# Patient Record
Sex: Female | Born: 1970
Health system: Southern US, Community
[De-identification: ages and names within clinical notes are randomized; demographics above are authoritative.]

## PROBLEM LIST (undated history)

## (undated) DIAGNOSIS — I10 Essential (primary) hypertension: Secondary | ICD-10-CM

## (undated) DIAGNOSIS — E0590 Thyrotoxicosis, unspecified without thyrotoxic crisis or storm: Secondary | ICD-10-CM

## (undated) DIAGNOSIS — E079 Disorder of thyroid, unspecified: Secondary | ICD-10-CM

## (undated) DIAGNOSIS — E119 Type 2 diabetes mellitus without complications: Secondary | ICD-10-CM

## (undated) DIAGNOSIS — C541 Malignant neoplasm of endometrium: Secondary | ICD-10-CM

## (undated) DIAGNOSIS — D649 Anemia, unspecified: Secondary | ICD-10-CM

## (undated) HISTORY — DX: Malignant neoplasm of endometrium: C54.1

## (undated) HISTORY — DX: Disorder of thyroid, unspecified: E07.9

## (undated) HISTORY — PX: ECTOPIC PREGNANCY SURGERY: SHX613

## (undated) HISTORY — PX: ABDOMINAL HYSTERECTOMY: SHX81

## (undated) HISTORY — DX: Anemia, unspecified: D64.9

## (undated) HISTORY — PX: DILATION AND CURETTAGE, DIAGNOSTIC / THERAPEUTIC: SUR384

---

## 1997-11-24 ENCOUNTER — Inpatient Hospital Stay (HOSPITAL_COMMUNITY): Admission: RE | Admit: 1997-11-24 | Discharge: 1997-11-24 | Payer: Self-pay | Admitting: *Deleted

## 2004-11-09 ENCOUNTER — Emergency Department: Payer: Self-pay | Admitting: Emergency Medicine

## 2010-07-07 ENCOUNTER — Emergency Department: Payer: Self-pay | Admitting: Emergency Medicine

## 2010-07-08 ENCOUNTER — Emergency Department: Payer: Self-pay | Admitting: Internal Medicine

## 2011-01-14 ENCOUNTER — Ambulatory Visit: Payer: Self-pay | Admitting: Family Medicine

## 2011-01-20 ENCOUNTER — Ambulatory Visit: Payer: Self-pay | Admitting: Family Medicine

## 2011-02-20 ENCOUNTER — Ambulatory Visit: Payer: Self-pay | Admitting: Family Medicine

## 2011-07-23 HISTORY — PX: BREAST BIOPSY: SHX20

## 2011-12-26 ENCOUNTER — Ambulatory Visit: Payer: PRIVATE HEALTH INSURANCE | Admitting: Internal Medicine

## 2012-01-30 ENCOUNTER — Ambulatory Visit: Payer: Self-pay | Admitting: Family Medicine

## 2012-02-12 ENCOUNTER — Ambulatory Visit: Payer: Self-pay | Admitting: Family Medicine

## 2012-04-05 ENCOUNTER — Emergency Department: Payer: Self-pay | Admitting: Emergency Medicine

## 2012-04-05 LAB — CBC
HCT: 34.6 % — ABNORMAL LOW (ref 35.0–47.0)
MCH: 24.9 pg — ABNORMAL LOW (ref 26.0–34.0)
MCHC: 32.6 g/dL (ref 32.0–36.0)
Platelet: 332 10*3/uL (ref 150–440)
RBC: 4.54 10*6/uL (ref 3.80–5.20)
RDW: 14.7 % — ABNORMAL HIGH (ref 11.5–14.5)

## 2012-04-05 LAB — BASIC METABOLIC PANEL WITH GFR
Anion Gap: 10 (ref 7–16)
BUN: 13 mg/dL (ref 7–18)
Calcium, Total: 8.4 mg/dL — ABNORMAL LOW (ref 8.5–10.1)
Chloride: 107 mmol/L (ref 98–107)
Co2: 27 mmol/L (ref 21–32)
Creatinine: 0.85 mg/dL (ref 0.60–1.30)
EGFR (African American): >60
EGFR (Non-African Amer.): >60
Glucose: 102 mg/dL — ABNORMAL HIGH (ref 65–99)
Osmolality: 287 (ref 275–301)
Potassium: 3.4 mmol/L — ABNORMAL LOW (ref 3.5–5.1)
Sodium: 144 mmol/L (ref 136–145)

## 2012-04-05 LAB — CK TOTAL AND CKMB (NOT AT ARMC)
CK, Total: 150 U/L (ref 21–215)
CK-MB: 0.9 ng/mL (ref 0.5–3.6)

## 2012-04-05 LAB — PRO B NATRIURETIC PEPTIDE: B-Type Natriuretic Peptide: 78 pg/mL (ref 0–125)

## 2012-04-05 LAB — TROPONIN I: Troponin-I: <0.02 ng/mL

## 2012-04-05 LAB — TSH: Thyroid Stimulating Horm: 3.89 u[IU]/mL

## 2012-04-13 ENCOUNTER — Ambulatory Visit: Payer: Self-pay | Admitting: Surgery

## 2012-04-15 LAB — PATHOLOGY REPORT

## 2013-02-11 DIAGNOSIS — G4733 Obstructive sleep apnea (adult) (pediatric): Secondary | ICD-10-CM | POA: Insufficient documentation

## 2013-02-11 DIAGNOSIS — G4730 Sleep apnea, unspecified: Secondary | ICD-10-CM | POA: Insufficient documentation

## 2013-06-26 ENCOUNTER — Ambulatory Visit: Payer: Self-pay | Admitting: Physician Assistant

## 2014-01-13 DIAGNOSIS — R7303 Prediabetes: Secondary | ICD-10-CM | POA: Insufficient documentation

## 2014-04-06 ENCOUNTER — Other Ambulatory Visit: Payer: Self-pay | Admitting: Physician Assistant

## 2014-04-06 LAB — URIC ACID: URIC ACID: 4.5 mg/dL (ref 2.6–6.0)

## 2014-09-23 ENCOUNTER — Ambulatory Visit: Payer: Self-pay | Admitting: Family Medicine

## 2015-08-08 ENCOUNTER — Ambulatory Visit: Payer: Self-pay | Admitting: Physician Assistant

## 2015-08-08 ENCOUNTER — Encounter: Payer: Self-pay | Admitting: Physician Assistant

## 2015-08-08 VITALS — BP 143/86 | HR 64 | Temp 98.6°F

## 2015-08-08 DIAGNOSIS — M545 Low back pain, unspecified: Secondary | ICD-10-CM

## 2015-08-08 MED ORDER — BACLOFEN 10 MG PO TABS: 10.0000 mg | ORAL_TABLET | Freq: Three times a day (TID) | ORAL | Status: DC

## 2015-08-08 MED ORDER — METHYLPREDNISOLONE 4 MG PO TBPK: ORAL_TABLET | ORAL | Status: DC

## 2015-08-08 NOTE — Progress Notes (Signed)
S:  C/o low back pain for 1 week, no known injury, pain is worse with movement, increased with bending over and standing for long period of time, when it flares up she has  numbness, tingling in r leg; denies changes in bowel/urinary habits,  Using otc meds without relief Remainder ros neg  O:  Vitals wnl, nad, lungs c t a, cv rrr, spine nontender, pain reporduced with palpation of upper gluteal muscle ,  Neg slr, pt walks without difficulty, no foot drop noted, n/v intact  A: acute back pain with radiation to r leg  P: medrol dose pack, baclofen; use wet heat followed by ice, stretches, return to clinic if not better in 3 t 5 days, return earlier if worsening, rx meds:

## 2016-03-15 ENCOUNTER — Ambulatory Visit: Payer: Self-pay | Admitting: Physician Assistant

## 2016-03-15 ENCOUNTER — Encounter: Payer: Self-pay | Admitting: Physician Assistant

## 2016-03-15 ENCOUNTER — Ambulatory Visit
Admission: RE | Admit: 2016-03-15 | Discharge: 2016-03-15 | Disposition: A | Discharge status: Home / Self Care | Payer: 59 | Source: Ambulatory Visit | Attending: Physician Assistant | Admitting: Physician Assistant

## 2016-03-15 VITALS — BP 120/80 | HR 84 | Temp 98.6°F

## 2016-03-15 DIAGNOSIS — M79605 Pain in left leg: Secondary | ICD-10-CM

## 2016-03-15 DIAGNOSIS — M545 Low back pain, unspecified: Secondary | ICD-10-CM

## 2016-03-15 MED ORDER — BACLOFEN 10 MG PO TABS: 10.0000 mg | ORAL_TABLET | Freq: Three times a day (TID) | ORAL | 0 refills | Status: DC

## 2016-03-15 NOTE — Progress Notes (Signed)
S: c/l left lower leg pain and back pain, states lower leg has been hurting for about 2 weeks, now her lower back is starting to spasm, no known injury, no numbness or tingling, nonsmoker, no hormone replacement therapy, is obese  O: vitals wnl, nad, left lower leg tender along back and behind left knee, no cord palpated, n/v intact, spine nontender, pt rises slowly and walks with a limp, no foot drop noted  A: lower leg pain, back pain  P: [place] left lower due to area of pain and pt being obese, baclofen for muscle spasms, otc nsaid of choice, ice

## 2016-03-18 ENCOUNTER — Emergency Department
Admission: EM | Admit: 2016-03-18 | Discharge: 2016-03-18 | Disposition: A | Discharge status: Home / Self Care | Payer: 59 | Attending: Student | Admitting: Student

## 2016-03-18 DIAGNOSIS — I10 Essential (primary) hypertension: Secondary | ICD-10-CM | POA: Insufficient documentation

## 2016-03-18 DIAGNOSIS — Z7984 Long term (current) use of oral hypoglycemic drugs: Secondary | ICD-10-CM | POA: Insufficient documentation

## 2016-03-18 DIAGNOSIS — M5442 Lumbago with sciatica, left side: Secondary | ICD-10-CM | POA: Diagnosis not present

## 2016-03-18 DIAGNOSIS — Z79899 Other long term (current) drug therapy: Secondary | ICD-10-CM | POA: Insufficient documentation

## 2016-03-18 DIAGNOSIS — M545 Low back pain: Secondary | ICD-10-CM | POA: Diagnosis present

## 2016-03-18 DIAGNOSIS — M5432 Sciatica, left side: Secondary | ICD-10-CM | POA: Diagnosis not present

## 2016-03-18 HISTORY — DX: Essential (primary) hypertension: I10

## 2016-03-18 MED ORDER — PREDNISONE 10 MG PO TABS: ORAL_TABLET | ORAL | 0 refills | Status: DC

## 2016-03-18 NOTE — ED Triage Notes (Signed)
Pt c/o left lower back pain for the past week.

## 2016-03-18 NOTE — ED Notes (Signed)
See triage note  States she developed pain to left knee and then pain radiated to hip/lower back  Was seen at clinic last week  Started on ibu and baclofen  states min relief   Unsure of injury  Pain increases with movement

## 2016-03-18 NOTE — ED Provider Notes (Signed)
[facility] Emergency Department Provider Note  ____________________________________________  Time seen: Approximately 9:11 AM  I have reviewed the triage vital signs and the nursing notes.   HISTORY  Chief Complaint Back Pain    HPI Cassandra Sparks is a 45 y.o. female , NAD, presents to emergency department with 1 week history of left lower back pain. Patient states she has had off and on issues with lower back pain and sciatica in which she relates to her weight as well as a sedentary full-time job in which she sits for most of her shift. Had onset of left lower back pain one week ago that has gradually worsened over time. Has had radiation of pain through the left lower extremity which currently has resolved. Patient was seen by her primary care provider at the [facility]: [facility] health center in which venous Doppler ultrasound was completed and negative for any signs of DVT. Patient was placed on ibuprofen and baclofen in which she has been taking as prescribed. Patient was offered prednisone Dosepak at that time but she felt that her pain and symptoms were not severe enough at that time to take the prednisone. She does believe the prednisone would do better for at this time as she is not improving on ibuprofen. Patient denies any chest pain, shortness of breath, abdominal pain, nausea, vomiting, saddle paresthesias, numbness, weakness, tingling, loss of bowel or bladder control. Has not noted any rashes or skin sores about her back. Has been complaining some back stretching and exercises but notes that is rare.   Past Medical History:  Diagnosis Date  . Hypertension     There are no active problems to display for this patient.   No past surgical history on file.  Prior to Admission medications   Medication Sig Start Date End Date Taking? Authorizing Provider  baclofen (LIORESAL) 10 MG tablet Take 1 tablet (10 mg total) by mouth 3 (three) times daily. 03/15/16    Versie Starks, PA-C  hydrochlorothiazide (HYDRODIURIL) 25 MG tablet  08/03/15   Historical Provider, MD  levothyroxine (SYNTHROID, LEVOTHROID) 125 MCG tablet  08/03/15   Historical Provider, MD  lisinopril (PRINIVIL,ZESTRIL) 10 MG tablet  08/03/15   Historical Provider, MD  metFORMIN (GLUCOPHAGE) 500 MG tablet  08/03/15   Historical Provider, MD  predniSONE (DELTASONE) 10 MG tablet Take a daily regimen of 6,5,4,3,2,1 03/18/16   Jami L Hagler, PA-C    Allergies Review of patient's allergies indicates no known allergies.  No family history on file.  Social History Social History  Substance Use Topics  . Smoking status: Never Smoker  . Smokeless tobacco: Never Used  . Alcohol use No     Review of Systems  Constitutional: No fever/chills Cardiovascular: No chest pain. Respiratory: No shortness of breath. No wheezing.  Gastrointestinal: No abdominal pain.  No nausea, vomiting.  Musculoskeletal: Positive left lower back pain radiating in the left leg.  Skin: Negative for rash, Redness, swelling, skin sores, bruising. Neurological: Negative for headaches, focal weakness or numbness.No tingling, saddle paresthesias, loss of bowel or bladder control. 10-point ROS otherwise negative.  ____________________________________________   PHYSICAL EXAM:  VITAL SIGNS: ED Triage Vitals  Enc Vitals Group     BP 03/18/16 0904 (!) 188/84     Pulse Rate 03/18/16 0904 95     Resp 03/18/16 0904 18     Temp 03/18/16 0904 98.8 F (37.1 C)     Temp Source 03/18/16 0904 Oral     SpO2 03/18/16  0904 98 %     Weight 03/18/16 0904 250 lb (113.4 kg)     Height 03/18/16 0904 5\' 4"  (1.626 m)     Head Circumference --      Peak Flow --      Pain Score 03/18/16 0905 8     Pain Loc --      Pain Edu? --      Excl. in [place]? --     Constitutional: Alert and oriented. Well appearing and in no acute distress. Eyes: Conjunctivae are normal without icterus or injection Head: Atraumatic. Neck: Supple with full  range of motion Hematological/Lymphatic/Immunilogical: No cervical lymphadenopathy. Cardiovascular:Good peripheral circulation. Respiratory: Normal respiratory effort without tachypnea or retractions.  Musculoskeletal: No tenderness to palpation of the thoracic, lumbar, sacral spinal area. Left SI joint tenderness to deep palpation. No right SI joint tenderness. Positive left straight leg raise. Negative right straight leg raise. Decreased range of motion of the lumbar spine with flexion due to pain. No muscle spasms appreciated about the lumbar spine. No lower extremity tenderness nor edema.  No joint effusions. Neurologic:  Normal speech and language. No gross focal neurologic deficits are appreciated.  Skin:  Skin is warm, dry and intact. No rash, redness, swelling, skin sores noted. Psychiatric: Mood and affect are normal. Speech and behavior are normal. Patient exhibits appropriate insight and judgement.   ____________________________________________   LABS  None ____________________________________________  EKG  None ____________________________________________  RADIOLOGY  None ____________________________________________    PROCEDURES  Procedure(s) performed: None   Procedures   Medications - No data to display   ____________________________________________   INITIAL IMPRESSION / ASSESSMENT AND PLAN / ED COURSE  Pertinent labs & imaging results that were available during my care of the patient were reviewed by me and considered in my medical decision making (see chart for details).  Clinical Course    Patient's diagnosis is consistent with Sciatica of left side. Patient will be discharged home with prescriptions for prednisone to take as directed. Patient is advised to discontinue use of ibuprofen and may use Tylenol as needed for pain. Patient is continue baclofen as previous prescribed. Patient is to follow up with her primary care provider if symptoms  persist past this treatment course. Patient is given ED precautions to return to the ED for any worsening or new symptoms.     ____________________________________________  FINAL CLINICAL IMPRESSION(S) / ED DIAGNOSES  Final diagnoses:  Sciatica of left side      NEW MEDICATIONS STARTED DURING THIS VISIT:  Discharge Medication List as of 03/18/2016  9:28 AM    START taking these medications   Details  predniSONE (DELTASONE) 10 MG tablet Take a daily regimen of 6,5,4,3,2,1, Print             Braxton Feathers, PA-C 03/18/16 1033    Joanne Gavel, MD 03/19/16 1036

## 2016-03-18 NOTE — Discharge Instructions (Signed)
Discontinue ibuprofen while on prednisone  May take Tylenol as needed for pain.   Complete range of motion exercises and stretches daily.

## 2016-04-16 ENCOUNTER — Ambulatory Visit: Payer: Self-pay | Admitting: Physician Assistant

## 2016-04-16 ENCOUNTER — Encounter: Payer: Self-pay | Admitting: Physician Assistant

## 2016-04-16 VITALS — BP 110/90 | HR 76 | Temp 98.5°F

## 2016-04-16 DIAGNOSIS — R202 Paresthesia of skin: Secondary | ICD-10-CM

## 2016-04-16 LAB — GLUCOSE, POCT (MANUAL RESULT ENTRY): POC Glucose: 81 mg/dl (ref 70–99)

## 2016-04-16 NOTE — Progress Notes (Signed)
[facility] requesting appt with Dr Phyllis Ginger spoke with Caryl Pina instructed me to fax over Demo,progress note and insurance. They will contact patient with appointment day and time. LM for patient about this. Information faxed to [phone]

## 2016-04-16 NOTE — Progress Notes (Signed)
S: c/o foot tingling, had back pain but that is better, now foot has tingling and stinging, hx of diabetes, is on metformin and synthroid, pcp [facility], hasn't seen in over a year, unsure what her A1C is, knows her weight is causing problems with her back and legs, no known injury, was seen in ER about a week ago and told she has sciatica  O: vitals wnl, nad, spine nontender, foot with full rom and strength, able to feel soft touch in all areas, no sores or open wounds noted on foot, n/v intact  A: diabetic with paresthesias  P: f/u with ortho for eval of back /sciatica, pcp for eval of diabetes

## 2016-04-30 DIAGNOSIS — R7309 Other abnormal glucose: Secondary | ICD-10-CM | POA: Diagnosis not present

## 2016-04-30 DIAGNOSIS — Z01419 Encounter for gynecological examination (general) (routine) without abnormal findings: Secondary | ICD-10-CM | POA: Diagnosis not present

## 2016-04-30 DIAGNOSIS — I10 Essential (primary) hypertension: Secondary | ICD-10-CM | POA: Diagnosis not present

## 2016-04-30 DIAGNOSIS — Z124 Encounter for screening for malignant neoplasm of cervix: Secondary | ICD-10-CM | POA: Diagnosis not present

## 2016-04-30 DIAGNOSIS — E039 Hypothyroidism, unspecified: Secondary | ICD-10-CM | POA: Diagnosis not present

## 2016-05-02 ENCOUNTER — Other Ambulatory Visit: Payer: Self-pay | Admitting: Family Medicine

## 2016-05-02 DIAGNOSIS — Z1231 Encounter for screening mammogram for malignant neoplasm of breast: Secondary | ICD-10-CM

## 2016-05-07 DIAGNOSIS — M79671 Pain in right foot: Secondary | ICD-10-CM | POA: Insufficient documentation

## 2016-05-07 DIAGNOSIS — M79672 Pain in left foot: Secondary | ICD-10-CM | POA: Diagnosis not present

## 2016-05-07 DIAGNOSIS — R200 Anesthesia of skin: Secondary | ICD-10-CM | POA: Insufficient documentation

## 2016-05-07 DIAGNOSIS — R29898 Other symptoms and signs involving the musculoskeletal system: Secondary | ICD-10-CM | POA: Insufficient documentation

## 2016-06-06 ENCOUNTER — Ambulatory Visit
Admission: RE | Admit: 2016-06-06 | Discharge: 2016-06-06 | Disposition: A | Discharge status: Home / Self Care | Payer: 59 | Source: Ambulatory Visit | Attending: Family Medicine | Admitting: Family Medicine

## 2016-06-06 ENCOUNTER — Encounter (HOSPITAL_COMMUNITY): Payer: Self-pay

## 2016-06-06 DIAGNOSIS — Z1231 Encounter for screening mammogram for malignant neoplasm of breast: Secondary | ICD-10-CM | POA: Insufficient documentation

## 2016-07-02 ENCOUNTER — Encounter: Payer: 59 | Attending: Neurology | Admitting: Dietician

## 2016-07-02 ENCOUNTER — Encounter: Payer: Self-pay | Admitting: Dietician

## 2016-07-02 VITALS — Ht 67.0 in | Wt 331.5 lb

## 2016-07-02 DIAGNOSIS — E669 Obesity, unspecified: Secondary | ICD-10-CM | POA: Diagnosis not present

## 2016-07-02 DIAGNOSIS — IMO0001 Reserved for inherently not codable concepts without codable children: Secondary | ICD-10-CM

## 2016-07-02 DIAGNOSIS — Z713 Dietary counseling and surveillance: Secondary | ICD-10-CM | POA: Diagnosis not present

## 2016-07-02 NOTE — Progress Notes (Signed)
Medical Nutrition Therapy: Visit start time: 1330   end time: [id number] Assessment:  Diagnosis: obesity Past medical history: hypertension, Pt. Stated she also has pre-diabetes Psychosocial issues/ stress concerns: Patient rates her stress as moderate and indicates "ok" as to how well she is dealing with her stress. Preferred learning method:  . No preference indicated  Current weight: 331.5 lbs Height: 67 in Medications, supplements: see list Progress and evaluation:  Patient in for initial medical nutrition therapy appointment. She reports she has been taking phentermine since 04/2016 and this has decreased her appetite making it easier for her to lose weight. She reports a highest weight of 350 lbs and has had a net loss of 18.5 lbs. She states that previously she was eating "Fast Food" for most breakfast and lunch meals and many of her dinner meals. She drank soda daily.  Presently she is drinking Slim Fast for breakfast and for lunch at least 2 days per week. She is trying to include a vegetable or fruit with the Slim Fast.  Her dinner meal is usually "take out" such as chicken wings and greens.  She has eliminated most of her sweetened beverages. She works 2 jobs; one from 9:00am to 2:00pm and one from 3:00pm- 11:00pm Monday-Friday.   Physical activity: none   Nutrition Care Education:   Weight control: Acknowledged how work schedule makes it more difficult to prepare healthy foods. Commended on her effort to decrease calories and success with weight loss. Expressed concern about using SlimFast on a frequent basis in place of meals and she stated, "I'm getting so tired of SlimFast". Gave and discussed examples of simple breakfast menus as well as simple lunches to take to work. Discussed how she could keep vegetables and fruits at her 2nd job to add to "take out" entrees rather than eating chips, fries. Etc. Used food guide plate and "Planning a Balanced Meal" to show food groups needed,  portion control and how to better balance carbohydrate, protein and non-starchy vegetables.  Nutritional Diagnosis:  [place]-3.3 Overweight/obesity As related to previous high intake of "Fast Foods" and sweetened beverages.  As evidenced by diet history..  Intervention:  Include a breakfast before going to work. Ex. Cheese toast, fruit or egg on an English muffin, fruit Try to eat solid food at most of your meals but rather than skipping a meal, drink Boost Glucose control or sugar free Carnation Instant breakfast. Balance meals with 2-4 oz. of protein, 2-4 servings of carbohydrate and non-starchy vegetables. Refer to examples given. Add more of the non-starchy vegetables to add nutrients and fiber and to help satisfy appetite longer. Keep some vegetables and fruits at 2nd job to be able to add to "take out" entree.A Think of food guide plate when planning a meal or deciding what to order when "out". Add low calorie vegetables and fruit to balance and decrease portions of starch..    Education Materials given:   . Food lists/ Planning A Balanced Meal . Sample meal pattern/ menus . Goals/ instructions  Learner/ who was taught:  . Patient   Level of understanding: Marland Kitchen Verbalized understanding of instruction  Learning barriers: . None Willingness to learn/ readiness for change: . Eager, change in progress Monitoring and Evaluation:   Patient did not schedule a follow-up appointment today. Encouraged her to call if she desires further help with her diet/nutrition.

## 2016-07-02 NOTE — Patient Instructions (Addendum)
Include a breakfast before going to work. Ex. Cheese toast, fruit or egg on an English muffin, fruit Try to eat solid food at most of your meals but rather than skipping a meal, drink Boost Glucose control or sugar free Carnation Instant breakfast. Balance meals with 2-4 oz. of protein, 2-4 servings of carbohydrate and non-starchy vegetables. Refer to examples given. Add more of the non-starchy vegetables to add nutrients and fiber and to help satisfy appetite longer. Keep some vegetables and fruits at 2nd job to be able to add to "take out" entree.A Think of food guide plate when planning a meal or deciding what to order when "out". Add low calorie vegetables and fruit to balance and decrease portions of starch.Marland Kitchen

## 2016-07-11 DIAGNOSIS — E669 Obesity, unspecified: Secondary | ICD-10-CM | POA: Diagnosis not present

## 2016-07-11 DIAGNOSIS — I10 Essential (primary) hypertension: Secondary | ICD-10-CM | POA: Diagnosis not present

## 2016-07-11 DIAGNOSIS — E039 Hypothyroidism, unspecified: Secondary | ICD-10-CM | POA: Diagnosis not present

## 2016-09-12 DIAGNOSIS — R7309 Other abnormal glucose: Secondary | ICD-10-CM | POA: Diagnosis not present

## 2016-09-12 DIAGNOSIS — E039 Hypothyroidism, unspecified: Secondary | ICD-10-CM | POA: Diagnosis not present

## 2016-09-12 DIAGNOSIS — I10 Essential (primary) hypertension: Secondary | ICD-10-CM | POA: Diagnosis not present

## 2016-12-05 DIAGNOSIS — E669 Obesity, unspecified: Secondary | ICD-10-CM | POA: Diagnosis not present

## 2016-12-05 DIAGNOSIS — M25562 Pain in left knee: Secondary | ICD-10-CM | POA: Diagnosis not present

## 2016-12-05 DIAGNOSIS — I10 Essential (primary) hypertension: Secondary | ICD-10-CM | POA: Diagnosis not present

## 2016-12-06 ENCOUNTER — Ambulatory Visit
Admission: RE | Admit: 2016-12-06 | Discharge: 2016-12-06 | Disposition: A | Discharge status: Home / Self Care | Payer: 59 | Source: Ambulatory Visit | Attending: Family Medicine | Admitting: Family Medicine

## 2016-12-06 ENCOUNTER — Other Ambulatory Visit: Payer: Self-pay | Admitting: Family Medicine

## 2016-12-06 DIAGNOSIS — M25562 Pain in left knee: Secondary | ICD-10-CM

## 2016-12-06 DIAGNOSIS — M25569 Pain in unspecified knee: Secondary | ICD-10-CM | POA: Diagnosis not present

## 2017-04-25 DIAGNOSIS — E039 Hypothyroidism, unspecified: Secondary | ICD-10-CM | POA: Diagnosis not present

## 2017-04-25 DIAGNOSIS — E669 Obesity, unspecified: Secondary | ICD-10-CM | POA: Diagnosis not present

## 2017-04-25 DIAGNOSIS — I10 Essential (primary) hypertension: Secondary | ICD-10-CM | POA: Diagnosis not present

## 2017-05-02 ENCOUNTER — Encounter: Payer: Self-pay | Admitting: Physician Assistant

## 2017-05-02 ENCOUNTER — Ambulatory Visit: Payer: Self-pay | Admitting: Physician Assistant

## 2017-05-02 VITALS — BP 110/70 | HR 90 | Temp 98.5°F | Resp 16

## 2017-05-02 DIAGNOSIS — M62838 Other muscle spasm: Secondary | ICD-10-CM

## 2017-05-02 MED ORDER — CYCLOBENZAPRINE HCL 10 MG PO TABS: 10.0000 mg | ORAL_TABLET | Freq: Three times a day (TID) | ORAL | 0 refills | Status: DC | PRN

## 2017-05-02 NOTE — Progress Notes (Signed)
S: c/o neck pain and spasms, cannot turn her head as far as she normally does, fell asleep in the bath tub the other night and sx started after that, no numbness or tingling in her arms or hands, no cp/sob, no headache, used an otc pain patch on her upper shoulder which didn't help  O: vitals wnl, nad, lungs c t a, cv rrr, cspine is not tender, decreased rom with rotation of neck, spasm in left shoulder, grips = b/l, nv intact  A: neck spasms  P: flexeril 10mg  tid , take otc ibuprofen or tylenol

## 2017-05-08 ENCOUNTER — Ambulatory Visit: Payer: Self-pay | Admitting: Physician Assistant

## 2017-05-08 ENCOUNTER — Encounter: Payer: Self-pay | Admitting: Physician Assistant

## 2017-05-08 VITALS — BP 140/90 | HR 81 | Temp 97.9°F

## 2017-05-08 DIAGNOSIS — M25512 Pain in left shoulder: Secondary | ICD-10-CM

## 2017-05-08 MED ORDER — METHYLPREDNISOLONE 4 MG PO TBPK: ORAL_TABLET | ORAL | 0 refills | Status: DC

## 2017-05-08 NOTE — Progress Notes (Signed)
S: had "crick" in her neck and we gave her flexeril which helped, now the pain is in her left upper arm, stings and kept her awake, had to sit up in recliner to sleep last night, no known injury, no fever/chills, no cp/sob  O: vitals wnl, nad, skin intact, left bicep tendon is tender, tender at deltoid, full rom, shoulders are spasmed b/l, n/v intact  A: left shoulder pain  P: medrol dose pack, continue flexeril, if not better by Monday will refer to ortho

## 2017-05-12 ENCOUNTER — Telehealth: Payer: Self-pay | Admitting: Physician Assistant

## 2017-05-12 ENCOUNTER — Ambulatory Visit: Payer: Self-pay | Admitting: Physician Assistant

## 2017-05-12 ENCOUNTER — Encounter: Payer: Self-pay | Admitting: Physician Assistant

## 2017-05-12 VITALS — BP 124/80 | HR 100 | Temp 98.4°F

## 2017-05-12 DIAGNOSIS — M25512 Pain in left shoulder: Secondary | ICD-10-CM

## 2017-05-12 MED ORDER — GABAPENTIN 300 MG PO CAPS: 300.0000 mg | ORAL_CAPSULE | Freq: Three times a day (TID) | ORAL | 3 refills | Status: DC

## 2017-05-12 NOTE — Progress Notes (Signed)
S: continued left shoulder pain, can't sleep at night, feels like something rolls at upper arm and then pain radiates to forearm, no loss of motion, no loss of strength, just hurts really bad  O: vitals wnl,  Nad, cspine is not tender, left shoulder is minimally tender, left upper arm is minimally tender, full rom, grips = b/l, n/v intact  A: acute left shoulder and arm pain,   P: gabapentin 300mg  qhs, refer to ortho

## 2017-05-12 NOTE — Telephone Encounter (Signed)
I can order an xray but it costs around $250.  Please let her know this prior to me ordering it.  I don't think an xray will show anything.  She will need a MRI which the orthopedic doctor would order

## 2017-05-13 NOTE — Progress Notes (Signed)
Contacted [facility] spoke with [facility] appointment scheduled with Dr. Candelaria Stagers on 05/14/2017 @ 1:00. Per patient LM of appointment time and day on her phone with contact # [phone]. Faxed notes to Dr Candelaria Stagers.

## 2017-05-14 DIAGNOSIS — M7552 Bursitis of left shoulder: Secondary | ICD-10-CM | POA: Diagnosis not present

## 2017-05-14 DIAGNOSIS — M25512 Pain in left shoulder: Secondary | ICD-10-CM | POA: Diagnosis not present

## 2017-05-19 ENCOUNTER — Ambulatory Visit: Payer: 59 | Attending: Sports Medicine

## 2017-05-19 DIAGNOSIS — M25512 Pain in left shoulder: Secondary | ICD-10-CM | POA: Insufficient documentation

## 2017-05-19 DIAGNOSIS — M25612 Stiffness of left shoulder, not elsewhere classified: Secondary | ICD-10-CM | POA: Diagnosis not present

## 2017-05-19 DIAGNOSIS — R293 Abnormal posture: Secondary | ICD-10-CM | POA: Diagnosis not present

## 2017-05-19 NOTE — Therapy (Signed)
[place] MAIN [facility] SERVICES [address] [place], [place], [address] Phone: [phone]   Fax:  [phone]  Physical Therapy Evaluation  Patient Details  Name: Cassandra Sparks MRN: [id number] Date of Birth: 05-05-1971 Referring Provider: Rosalia Hammers  Encounter Date: 05/19/2017      PT End of Session - 05/20/17 0754    Visit Number 1   Number of Visits 6   Date for PT Re-Evaluation 06/30/17   PT Start Time 0802   PT Stop Time 0858   PT Time Calculation (min) 56 min   Activity Tolerance Patient tolerated treatment well;Patient limited by pain   Behavior During Therapy [facility] for tasks assessed/performed      Past Medical History:  Diagnosis Date  . Hypertension     Past Surgical History:  Procedure Laterality Date  . BREAST BIOPSY Right 2013   neg    There were no vitals filed for this visit.       Subjective Assessment - 05/19/17 0812    Subjective Patient is a pleasant 46 year old female who presents with L shoulder pain.    Pertinent History Patient developed a crick in her neck and then went down into arm about 4 weeks ago. At night its worse when lay down. Uses ice and heat. Uses right hand now for most things due to pain. Received.  cortisone shot a week ago but has felt minimal relief.    Limitations House hold activities;Other (comment)   How long can you sit comfortably? Need to suppport L arm   Patient Stated Goals Pain relief of L arm    Currently in Pain? Yes   Pain Score 5    Pain Location Shoulder   Pain Orientation Left   Pain Descriptors / Indicators Aching   Pain Type Acute pain   Pain Onset 1 to 4 weeks ago   Pain Frequency Constant   Aggravating Factors  sleeping on it,    Pain Relieving Factors ice and heat, holding it up, prop it up on pillow      PAIN: Current pain: 5/10 Worst pain: 8/10 Average pain: 5/10  POSTURE: Supports L arm in seated position  PROM/AROM:    Right Left   Flexion 45  Extension 63  Side Bending 38 43 painful   Rotation 65 60     Right Left  Shoulder Flexion 155 154  Shoulder Abduction full 128 painful  ER full 20 painful  IR full    Inferior mobilization grade I painful, induces pain in mid delt region AP, PA mobilizations hypomobile due to guarding.   STRENGTH:  Graded on a 0-5 scale Muscle Group Left Right  Shoulder flex 4-/5 painful 5/5  Shoulder Abd 2+/5 painful 5/5  Shoulder Ext 3+/5 5/5  Shoulder IR/ER 2+/5 pain 5/5  Elbow 4-/5 painful 5/5  Wrist/hand     SENSATION: WFL  SPECIAL TESTS: - spurlings  +HK  - drop arm test - apprehension test - [place] +painful arc    OUTCOME MEASURES: TEST Outcome Interpretation  QuickDash 16%   QuickDash work 6.25%                        cross body adduction reduces pain  Treat: Cross body adduction Scapular retractions Forward traction on table of UE's       Objective measurements completed on examination: See above findings.  PT Education - 05/20/17 0753    Education provided Yes   Education Details HEP, rest, ice, need for opening up joint   Person(s) Educated Patient   Methods Explanation;Demonstration;Verbal cues   Comprehension Returned demonstration;Verbalized understanding          PT Short Term Goals - 05/20/17 0808      PT SHORT TERM GOAL #1   Title Patient will be independent in home exercise program to improve strength/mobility for better functional independence with ADLs.   Baseline HEP given   Time 2   Period Weeks   Status New   Target Date 06/02/17     PT SHORT TERM GOAL #2   Title Patient will retain upright posture with shoulders back and head forward to allow for proper [place] positioning for 5 consecutive minutes without cueing.    Baseline FHRS    Time 2   Period Weeks   Status New   Target Date 06/02/17           PT Long Term Goals - 05/20/17 0809      PT LONG TERM GOAL #1   Title  Patient will report a worst pain of 3/10 on VAS in L shoulder   to improve tolerance with ADLs and reduced symptoms with activities.    Baseline worst pain 8/10   Time 6   Period Weeks   Status New   Target Date 06/30/17     PT LONG TERM GOAL #2   Title  Patient will be able to perform household work/ chores without increase in symptoms   Baseline Symptoms increase to 8/10 at worst, average 5/10   Time 6   Period Weeks   Status New   Target Date 06/30/17     PT LONG TERM GOAL #3   Title Patient will improve shoulder AROM to > 140 degrees of flexion, scaption, and abduction for improved ability to perform overhead activities.   Baseline abduction 128    Time 6   Period Weeks   Status New   Target Date 06/30/17     PT LONG TERM GOAL #4   Title Patient will decrease Quick DASH score by > 8 points  (8%) demonstrating reduced self-reported upper extremity disability.   Baseline 10/30: 16%   Time 6   Period Weeks   Status New   Target Date 06/30/17                Plan - 05/20/17 0801    Clinical Impression Statement  Patient is a pleasant 9 year of female who presents to her physical therapy evaluation with L shoulder pain. Pain has begun about a month ago after feeling a "crick" in her neck. Cervical ROM and testing WFL. Shoulder pain indicative of bursitis/impingement. Most pain occurs during sleeping at night, limiting patients ability to sleep. QuickDash =16%, L shoulder abduction limited and painful. Patient educated on opening joint up for reduction of symptoms and utilization of ice. Patient will benefit from skilled physical therapy to reduce pain and return patient to prior level of function.    History and Personal Factors relevant to plan of care: This patient presents with 1-2 personal factors/ comorbidities  and 1-2 body elements including body structures and functions, activity limitations and or participation restrictions. Patient's condition is stable   Clinical  Presentation Stable   Clinical Presentation due to: pain not changing over time,    Clinical Decision Making Low   Rehab Potential Good   Clinical  Impairments Affecting Rehab Potential recent injury, compliance   PT Frequency 1x / week   PT Duration 6 weeks   PT Treatment/Interventions ADLs/Self Care Home Management;Biofeedback;Cryotherapy;Electrical Stimulation;Ultrasound;Traction;Iontophoresis 4mg /ml Dexamethasone;Functional mobility training;Therapeutic activities;Therapeutic exercise;Patient/family education;Neuromuscular re-education;Manual techniques;Passive range of motion;Taping;Energy conservation   PT Next Visit Plan distraction,  PROM, review HEP,posture   PT Home Exercise Plan see sheet   Consulted and Agree with Plan of Care Patient      Patient will benefit from skilled therapeutic intervention in order to improve the following deficits and impairments:  Decreased activity tolerance, Decreased range of motion, Decreased mobility, Decreased strength, Hypomobility, Impaired perceived functional ability, Impaired flexibility, Impaired UE functional use, Improper body mechanics, Postural dysfunction, Pain  Visit Diagnosis: Acute pain of left shoulder  Abnormal posture  Decreased shoulder mobility, left     Problem List There are no active problems to display for this patient.  Janna Arch, PT, DPT   Janna Arch 05/20/2017, 8:15 AM  [place] MAIN [facility] SERVICES [address] [place], [place], [address] Phone: [phone]   Fax:  [phone]  Name: NILI HONDA MRN: [id number] Date of Birth: 04-19-1971

## 2017-05-29 ENCOUNTER — Ambulatory Visit: Payer: 59

## 2017-06-02 ENCOUNTER — Ambulatory Visit: Payer: 59

## 2017-06-06 ENCOUNTER — Ambulatory Visit: Payer: 59

## 2017-06-10 ENCOUNTER — Ambulatory Visit: Payer: 59

## 2017-08-04 ENCOUNTER — Other Ambulatory Visit: Payer: Self-pay | Admitting: Family Medicine

## 2017-08-04 DIAGNOSIS — Z1231 Encounter for screening mammogram for malignant neoplasm of breast: Secondary | ICD-10-CM

## 2017-08-19 ENCOUNTER — Other Ambulatory Visit: Payer: Self-pay | Admitting: Family Medicine

## 2017-08-19 ENCOUNTER — Ambulatory Visit
Admission: RE | Admit: 2017-08-19 | Discharge: 2017-08-19 | Disposition: A | Discharge status: Home / Self Care | Payer: 59 | Source: Ambulatory Visit | Attending: Family Medicine | Admitting: Family Medicine

## 2017-08-19 DIAGNOSIS — Z1231 Encounter for screening mammogram for malignant neoplasm of breast: Secondary | ICD-10-CM | POA: Insufficient documentation

## 2017-10-24 DIAGNOSIS — Z0000 Encounter for general adult medical examination without abnormal findings: Secondary | ICD-10-CM | POA: Diagnosis not present

## 2017-10-24 DIAGNOSIS — E039 Hypothyroidism, unspecified: Secondary | ICD-10-CM | POA: Diagnosis not present

## 2017-10-24 DIAGNOSIS — I10 Essential (primary) hypertension: Secondary | ICD-10-CM | POA: Diagnosis not present

## 2017-10-24 DIAGNOSIS — Z1389 Encounter for screening for other disorder: Secondary | ICD-10-CM | POA: Diagnosis not present

## 2017-10-24 DIAGNOSIS — E669 Obesity, unspecified: Secondary | ICD-10-CM | POA: Diagnosis not present

## 2017-11-06 DIAGNOSIS — M79675 Pain in left toe(s): Secondary | ICD-10-CM | POA: Diagnosis not present

## 2017-11-06 DIAGNOSIS — M25571 Pain in right ankle and joints of right foot: Secondary | ICD-10-CM | POA: Diagnosis not present

## 2017-11-06 DIAGNOSIS — L84 Corns and callosities: Secondary | ICD-10-CM | POA: Diagnosis not present

## 2017-11-06 DIAGNOSIS — M79672 Pain in left foot: Secondary | ICD-10-CM | POA: Diagnosis not present

## 2017-11-14 DIAGNOSIS — S82891K Other fracture of right lower leg, subsequent encounter for closed fracture with nonunion: Secondary | ICD-10-CM | POA: Diagnosis not present

## 2017-11-14 DIAGNOSIS — M76821 Posterior tibial tendinitis, right leg: Secondary | ICD-10-CM | POA: Diagnosis not present

## 2017-11-14 DIAGNOSIS — M2041 Other hammer toe(s) (acquired), right foot: Secondary | ICD-10-CM | POA: Diagnosis not present

## 2017-11-14 DIAGNOSIS — M898X9 Other specified disorders of bone, unspecified site: Secondary | ICD-10-CM | POA: Diagnosis not present

## 2018-01-16 DIAGNOSIS — N951 Menopausal and female climacteric states: Secondary | ICD-10-CM | POA: Diagnosis not present

## 2018-01-16 DIAGNOSIS — E039 Hypothyroidism, unspecified: Secondary | ICD-10-CM | POA: Diagnosis not present

## 2018-01-16 DIAGNOSIS — I10 Essential (primary) hypertension: Secondary | ICD-10-CM | POA: Diagnosis not present

## 2018-01-16 DIAGNOSIS — R209 Unspecified disturbances of skin sensation: Secondary | ICD-10-CM | POA: Diagnosis not present

## 2018-01-16 DIAGNOSIS — G5600 Carpal tunnel syndrome, unspecified upper limb: Secondary | ICD-10-CM | POA: Diagnosis not present

## 2018-02-17 DIAGNOSIS — R200 Anesthesia of skin: Secondary | ICD-10-CM | POA: Diagnosis not present

## 2018-03-27 DIAGNOSIS — E039 Hypothyroidism, unspecified: Secondary | ICD-10-CM | POA: Diagnosis not present

## 2018-03-27 DIAGNOSIS — I10 Essential (primary) hypertension: Secondary | ICD-10-CM | POA: Diagnosis not present

## 2018-03-27 DIAGNOSIS — E669 Obesity, unspecified: Secondary | ICD-10-CM | POA: Diagnosis not present

## 2018-05-28 DIAGNOSIS — M5430 Sciatica, unspecified side: Secondary | ICD-10-CM | POA: Diagnosis not present

## 2018-05-28 DIAGNOSIS — E669 Obesity, unspecified: Secondary | ICD-10-CM | POA: Diagnosis not present

## 2018-06-16 DIAGNOSIS — E063 Autoimmune thyroiditis: Secondary | ICD-10-CM | POA: Insufficient documentation

## 2018-06-16 DIAGNOSIS — E0590 Thyrotoxicosis, unspecified without thyrotoxic crisis or storm: Secondary | ICD-10-CM | POA: Insufficient documentation

## 2018-08-23 IMAGING — MG MM DIGITAL SCREENING BILAT W/ TOMO W/ CAD
8 of 13 series · 8 of 29 positions shown · non-contrast
Comparison: Previous exam(s).

CLINICAL DATA: Screening.

EXAM:
2D DIGITAL SCREENING BILATERAL MAMMOGRAM WITH 3D TOMO WITH CAD

[R XCCM]
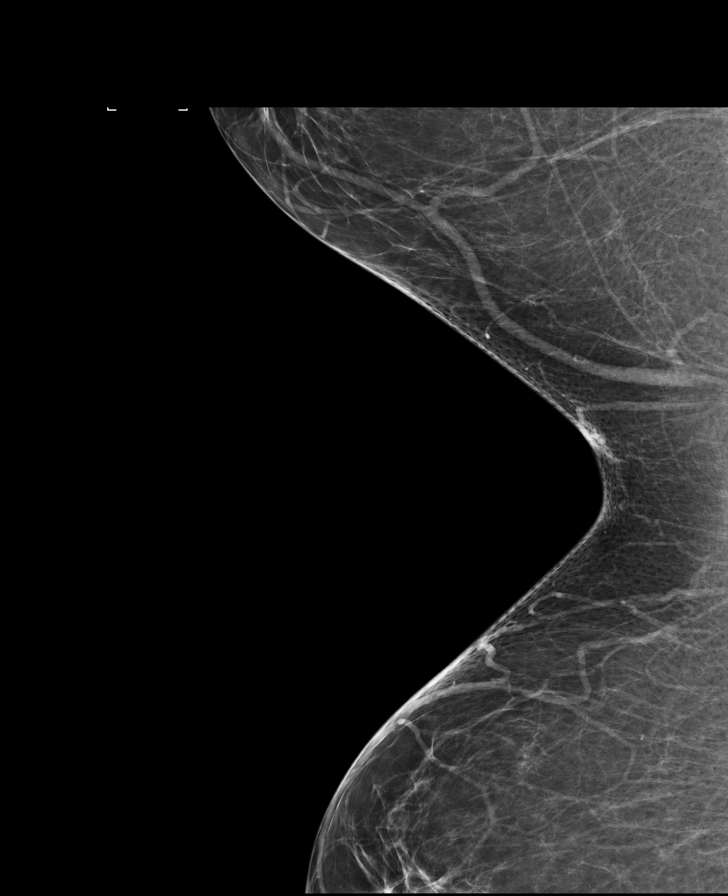

[L CC synth-2D]
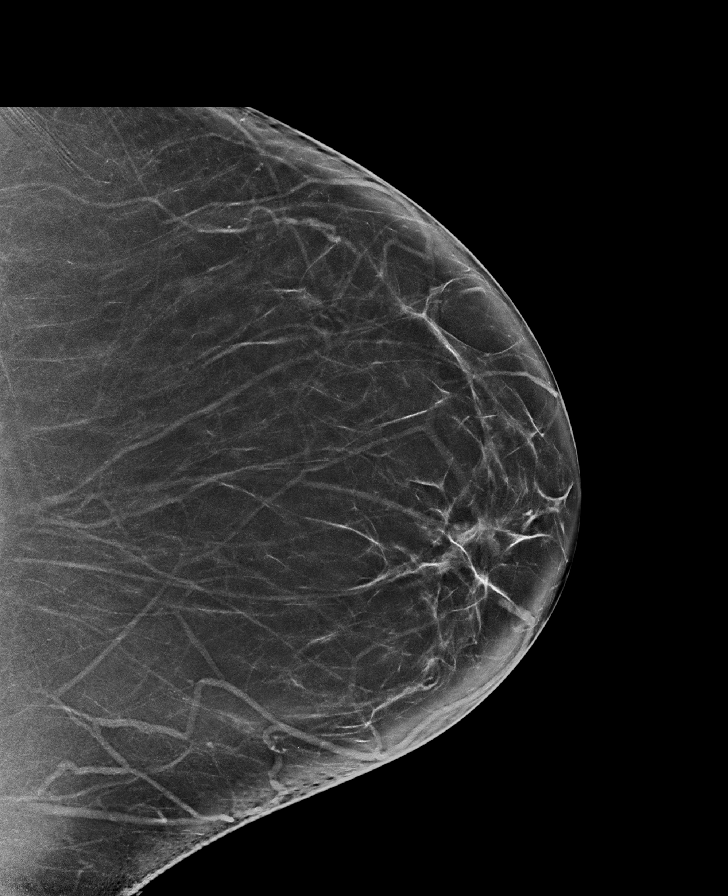

[L MLO]
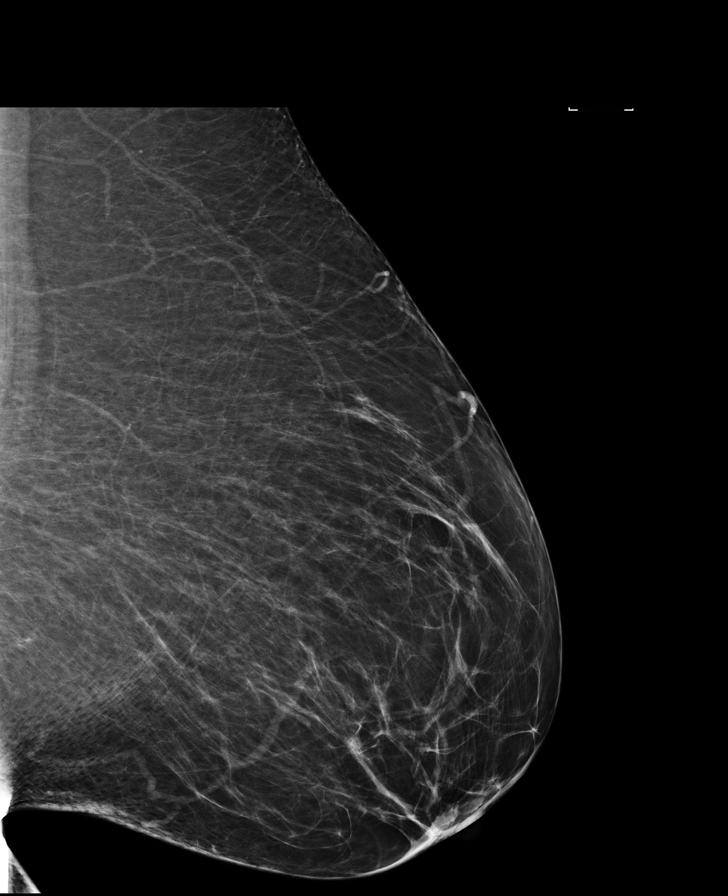

[R MLO synth-2D]
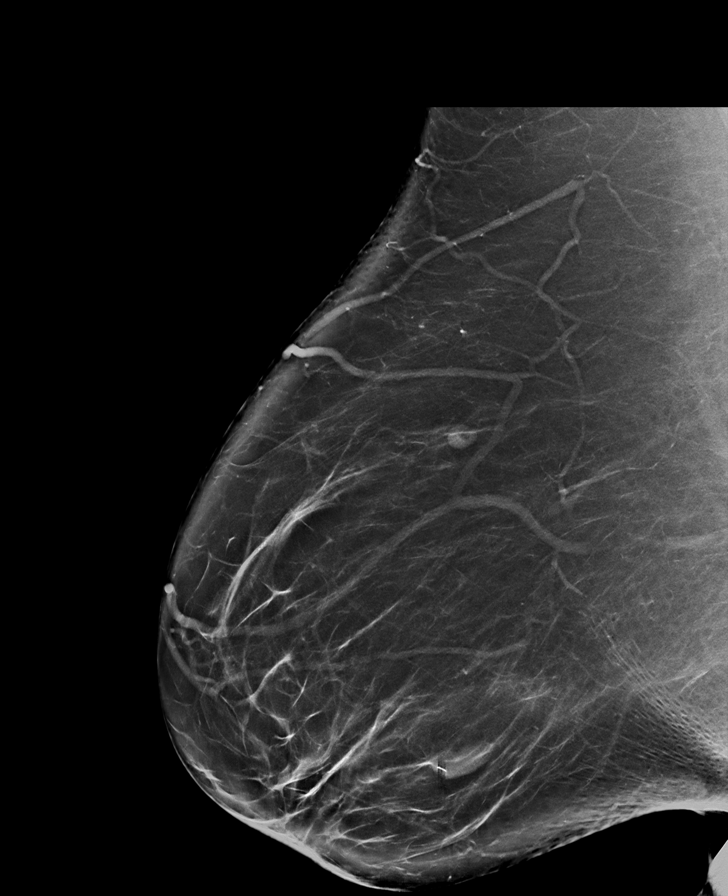

[R CC]
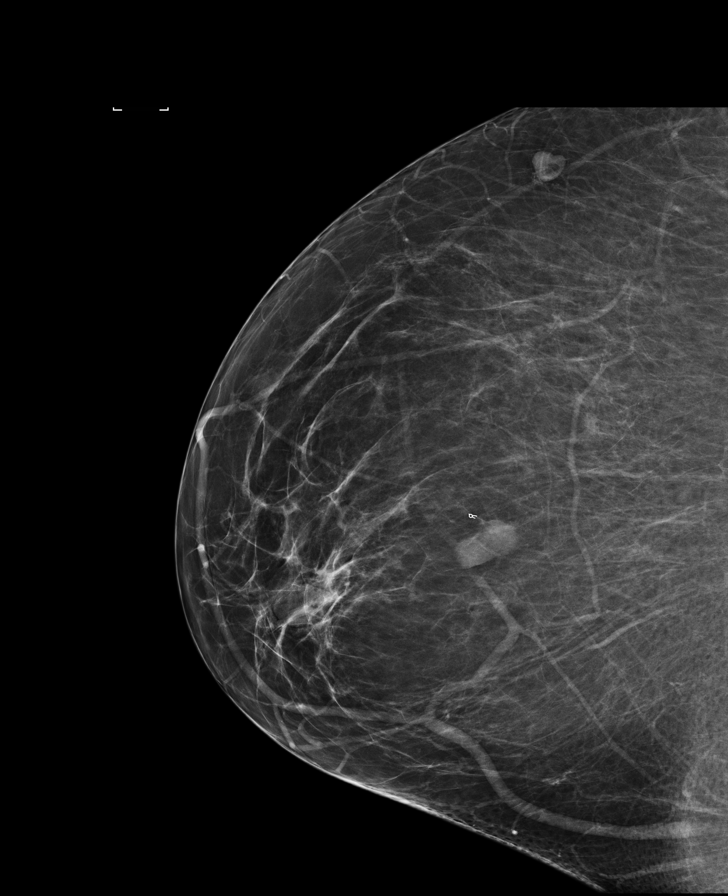

[L CC]
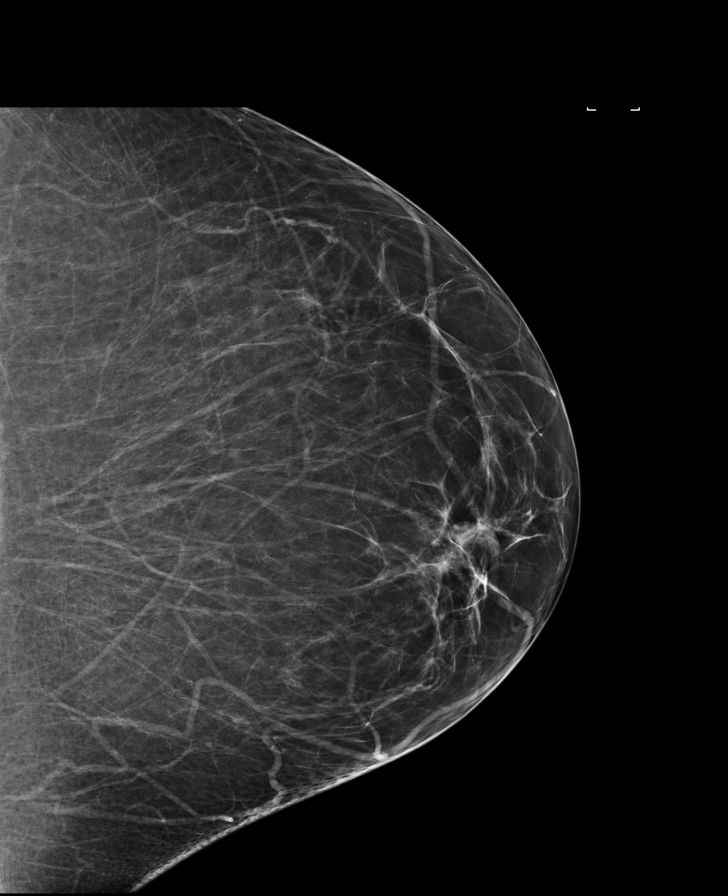

[R CC synth-2D]
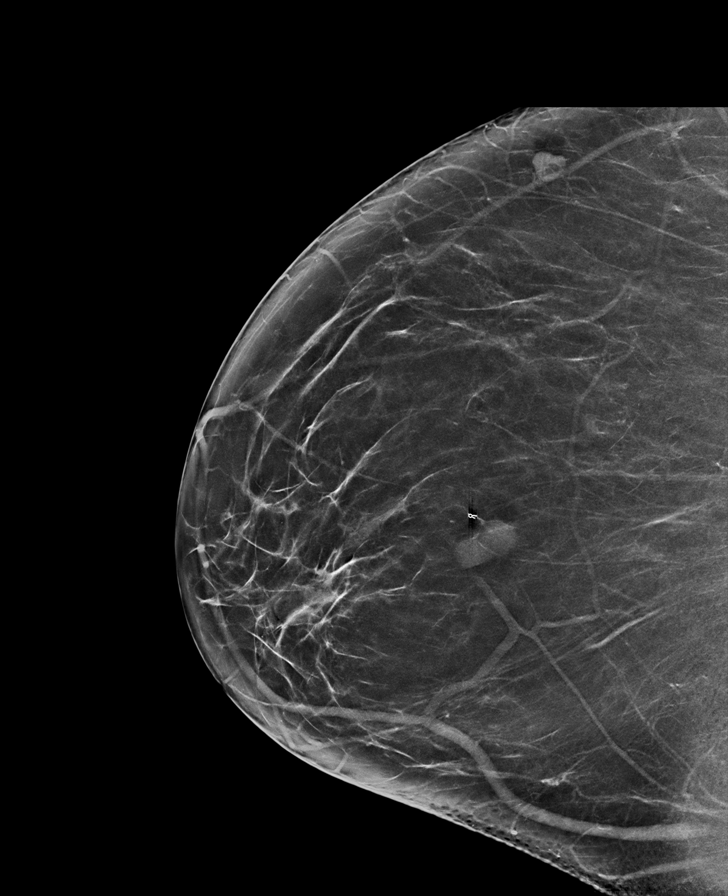

[R MLO]
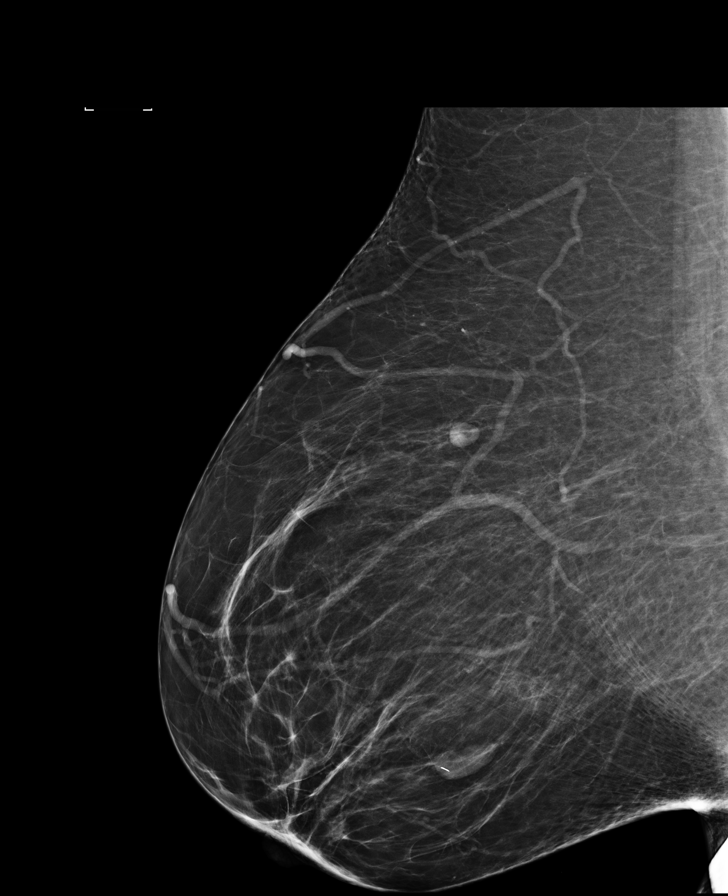

[8 of 29 positions shown; findings below may reference images not displayed]

ACR Breast Density Category b: There are scattered areas of
fibroglandular density.
FINDINGS: There are no findings suspicious for malignancy. Images were
processed with CAD.
IMPRESSION: No mammographic evidence of malignancy. A result letter of this
screening mammogram will be mailed directly to the patient.

RECOMMENDATION:
Screening mammogram in one year. (Code:GE-P-ZS0)

BI-RADS CATEGORY  1: Negative.

## 2018-08-27 DIAGNOSIS — E669 Obesity, unspecified: Secondary | ICD-10-CM | POA: Diagnosis not present

## 2018-08-27 DIAGNOSIS — E039 Hypothyroidism, unspecified: Secondary | ICD-10-CM | POA: Diagnosis not present

## 2018-08-27 DIAGNOSIS — I10 Essential (primary) hypertension: Secondary | ICD-10-CM | POA: Diagnosis not present

## 2018-08-27 DIAGNOSIS — Z23 Encounter for immunization: Secondary | ICD-10-CM | POA: Diagnosis not present

## 2018-12-03 DIAGNOSIS — Z1159 Encounter for screening for other viral diseases: Secondary | ICD-10-CM | POA: Diagnosis not present

## 2018-12-17 DIAGNOSIS — N938 Other specified abnormal uterine and vaginal bleeding: Secondary | ICD-10-CM | POA: Diagnosis not present

## 2018-12-17 DIAGNOSIS — E669 Obesity, unspecified: Secondary | ICD-10-CM | POA: Diagnosis not present

## 2019-01-04 DIAGNOSIS — R232 Flushing: Secondary | ICD-10-CM | POA: Diagnosis not present

## 2019-01-04 DIAGNOSIS — Z124 Encounter for screening for malignant neoplasm of cervix: Secondary | ICD-10-CM | POA: Diagnosis not present

## 2019-01-04 DIAGNOSIS — C541 Malignant neoplasm of endometrium: Secondary | ICD-10-CM | POA: Diagnosis not present

## 2019-01-04 DIAGNOSIS — R87619 Unspecified abnormal cytological findings in specimens from cervix uteri: Secondary | ICD-10-CM | POA: Diagnosis not present

## 2019-01-04 DIAGNOSIS — N939 Abnormal uterine and vaginal bleeding, unspecified: Secondary | ICD-10-CM | POA: Diagnosis not present

## 2019-01-08 NOTE — Progress Notes (Signed)
Chart reviewed. Well differentiated endometrioid carcinoma with mucinous differentiation (FIGO 1). Will arrange appt for 6/24.

## 2019-01-12 ENCOUNTER — Other Ambulatory Visit: Payer: Self-pay

## 2019-01-13 ENCOUNTER — Encounter: Payer: Self-pay | Admitting: Obstetrics and Gynecology

## 2019-01-13 ENCOUNTER — Inpatient Hospital Stay: Payer: 59 | Attending: Obstetrics and Gynecology | Admitting: Obstetrics and Gynecology

## 2019-01-13 ENCOUNTER — Other Ambulatory Visit: Payer: Self-pay | Admitting: Nurse Practitioner

## 2019-01-13 ENCOUNTER — Other Ambulatory Visit: Payer: Self-pay

## 2019-01-13 VITALS — BP 132/88 | HR 80 | Temp 97.3°F | Resp 16 | Ht 67.0 in | Wt 346.0 lb

## 2019-01-13 DIAGNOSIS — C541 Malignant neoplasm of endometrium: Secondary | ICD-10-CM | POA: Insufficient documentation

## 2019-01-13 DIAGNOSIS — Z923 Personal history of irradiation: Secondary | ICD-10-CM

## 2019-01-13 DIAGNOSIS — G5600 Carpal tunnel syndrome, unspecified upper limb: Secondary | ICD-10-CM | POA: Insufficient documentation

## 2019-01-13 DIAGNOSIS — A630 Anogenital (venereal) warts: Secondary | ICD-10-CM | POA: Diagnosis not present

## 2019-01-13 DIAGNOSIS — E8941 Symptomatic postprocedural ovarian failure: Secondary | ICD-10-CM

## 2019-01-13 DIAGNOSIS — I10 Essential (primary) hypertension: Secondary | ICD-10-CM | POA: Insufficient documentation

## 2019-01-13 DIAGNOSIS — E0590 Thyrotoxicosis, unspecified without thyrotoxic crisis or storm: Secondary | ICD-10-CM

## 2019-01-13 DIAGNOSIS — N898 Other specified noninflammatory disorders of vagina: Secondary | ICD-10-CM

## 2019-01-13 DIAGNOSIS — Z79890 Hormone replacement therapy (postmenopausal): Secondary | ICD-10-CM

## 2019-01-13 DIAGNOSIS — Z9221 Personal history of antineoplastic chemotherapy: Secondary | ICD-10-CM

## 2019-01-13 DIAGNOSIS — Z79899 Other long term (current) drug therapy: Secondary | ICD-10-CM | POA: Insufficient documentation

## 2019-01-13 DIAGNOSIS — Z6841 Body Mass Index (BMI) 40.0 and over, adult: Secondary | ICD-10-CM | POA: Insufficient documentation

## 2019-01-13 DIAGNOSIS — Z8542 Personal history of malignant neoplasm of other parts of uterus: Secondary | ICD-10-CM | POA: Insufficient documentation

## 2019-01-13 DIAGNOSIS — E119 Type 2 diabetes mellitus without complications: Secondary | ICD-10-CM | POA: Diagnosis not present

## 2019-01-13 NOTE — Patient Instructions (Signed)
DIVISION OF GYNECOLOGIC ONCOLOGY BOWEL PREP   The following instructions are extremely important to prepare for your surgery. Please follow them carefully   Step 1: Liquid Diet Instructions              Clear Liquid Diet for GYN Oncology Patients Day Before Surgery The day before your scheduled surgery DO NOT EAT any solid foods.  We do want you to drink enough liquids, but NO MILK products.  We do not want you to be dehydrated.  Clear liquids are defined as no milk products and no pieces of any solid food. Drink at least 64 oz. of fluid.  The following are all approved for you to drink the day before you surgery.  Chicken, Beef or Vegetable Broth (bouillon or consomm) - NO BROTH AFTER MIDNIGHT  Plain Jello  (no fruit)  Water  Strained lemonade or fruit punch  Gatorade (any flavor)  CLEAR Ensure or Boost Breeze  Fruit juices without pulp, such as apple, grape, or cranberry juice  Clear sodas - NO SODA AFTER MIDNIGHT  Ice Pops without bits of fruit or fruit pulp  Honey  Tea or coffee without milk or cream                 Any foods not on the above list should be avoided                                                                                             Step 2: Laxatives           The evening before surgery:   Time: around 5pm   Follow these instructions carefully.   Administer 1 Dulcolax suppository according to manufacturer instructions on the box. You will need to purchase this laxative at a pharmacy or grocery store.    Individual responses to laxatives vary; this prep may cause multiple bowel movements. It often works in 30 minutes and may take as long as 3 hours. Stay near an available bathroom.    It is important to stay hydrated. Ensure you are still drinking clear liquids.     IMPORTANT: FOR YOUR SAFETY, WE WILL HAVE TO CANCEL YOUR SURGERY IF YOU DO NOT FOLLOW THESE INSTRUCTIONS.    Do not eat anything after midnight (including gum or  candy) prior to your surgery.  Avoid drinking carbonated beverages after midnight.  You can have clear liquids up until one hour before you arrive at the hospital. "Nothing by mouth" means no liquids, gum, candy, etc for one hour before your arrival time.      Laparoscopy Laparoscopy is a procedure to diagnose diseases in the abdomen. During the procedure, a thin, lighted, pencil-sized instrument called a laparoscope is inserted into the abdomen through an incision. The laparoscope allows your health care provider to look at the organs inside your body. LET YOUR HEALTH CARE PROVIDER KNOW ABOUT:  Any allergies you have.  All medicines you are taking, including vitamins, herbs, eye drops, creams, and over-the-counter medicines.  Previous problems you or members of your family have had with the use of anesthetics.  Any blood   disorders you have.  Previous surgeries you have had.  Medical conditions you have. RISKS AND COMPLICATIONS  Generally, this is a safe procedure. However, problems can occur, which may include:  Infection.  Bleeding.  Damage to other organs.  Allergic reaction to the anesthetics used during the procedure. BEFORE THE PROCEDURE  Do not eat or drink anything after midnight on the night before the procedure or as directed by your health care provider.  Ask your health care provider about: ? Changing or stopping your regular medicines. ? Taking medicines such as aspirin and ibuprofen. These medicines can thin your blood. Do not take these medicines before your procedure if your health care provider instructs you not to.  Plan to have someone take you home after the procedure. PROCEDURE  You may be given a medicine to help you relax (sedative).  You will be given a medicine to make you sleep (general anesthetic).  Your abdomen will be inflated with a gas. This will make your organs easier to see.  Small incisions will be made in your abdomen.  A  laparoscope and other small instruments will be inserted into the abdomen through the incisions.  A tissue sample may be removed from an organ in the abdomen for examination.  The instruments will be removed from the abdomen.  The gas will be released.  The incisions will be closed with stitches (sutures). AFTER THE PROCEDURE  Your blood pressure, heart rate, breathing rate, and blood oxygen level will be monitored often until the medicines you were given have worn off.   This information is not intended to replace advice given to you by your health care provider. Make sure you discuss any questions you have with your health care provider.                                             Bowel Symptoms After Surgery After gynecologic surgery, women often have temporary changes in bowel function (constipation and gas pain).  Following are tips to help prevent and treat common bowel problems.  It also tells you when to call the doctor.  This is important because some symptoms might be a sign of a more serious bowel problem such as obstruction (bowel blockage).  These problems are rare but can happen after gynecologic surgery.   Besides surgery, what can temporarily affect bowel function? 1. Dietary changes   2. Decreased physical activity   3.Antibiotics   4. Pain medication   How can I prevent constipation (three days or more without a stool)? 1. Include fiber in your diet: whole grains, raw or dried fruits & vegetables, prunes, prune/pear juiceDrink at least 8 glasses of liquid (preferably water) every day 2. Avoid: ? Gas forming foods such as broccoli, beans, peas, salads, cabbage, sweet potatoes ? Greasy, fatty, or fried foods 3. Activity helps bowel function return to normal, walk around the house at least 3-4 times each day for 15 minutes or longer, if tolerated.  Rocking in a rocking chair is preferable to sitting still. 4. Stool softeners: these are not laxatives, but serve to soften  the stool to avoid straining.  Take 2-4 times a day until normal bowel function returns         Examples: Colace or generic equivalent (Docusate) 5. Bulk laxatives: provide a concentrated source of fiber.  They do not stimulate the bowel.    Take 1-2 times each day until normal bowel function return.              Examples: Citrucel, Metamucil, Fiberal, Fibercon   What can I take for "Gas Pains"? 1. Simethicone (Mylicon, Gas-X, Maalox-Gas, Mylanta-Gas) take 3-4 times a day 2. Maalox Regular - take 3-4 times a day 3. Mylanta Regular - take 3-4 times a day   What can I take if I become constipated? 1. Start with stool softeners and add additional laxatives below as needed to have a bowel movement every 1-2 days  2. Stool softeners 1-2 tablets, 2 times a day 3. Senokot 1-2 tablets, 1-2 times a day 4. Glycerin suppository can soften hard stool take once a day 5. Bisacodyl suppository once a day  6. Milk of Magnesia 30 mL 1-2 times a day 7. Fleets or tap water enema    What can I do for nausea?  1. Limit most solid foods for 24-48 hours 2. Continue eating small frequent amounts of liquids and/or bland soft foods ? Toast, crackers, cooked cereal (grits, cream of wheat, rice) 3. Benadryl: a mild anti-nausea medicine can be obtained without a prescription. May cause drowsiness, especially if taken with narcotic pain medicines 4. Contact provider for prescription nausea medication     What can I do, or take for diarrhea (more than five loose stools per day)? 1. Drink plenty of clear fluids to prevent dehydration 2. May take Kaopectate, Pepto-Bismol, Imodium, or probiotics for 1-2 days 3. Anusol or Preparation-H can be helpful for hemorrhoids and irritated tissue around anus   When should I call the doctor?             CONSTIPATION:   Not relieved after three days following the above program VOMITING:  That contains blood, "coffee ground" material  More the three times/hour and unable to  keep down nausea medication for more than eight hours  With dry mouth, dark or strong urine, feeling light-headed, dizzy, or confused  With severe abdominal pain or bloating for more than 24 hours DIARRHEA:  That continues for more then 24-48 hours despite treatment  That contains blood or tarry material  With dry mouth, dark or strong urine, feeling light~headed, dizzy, or confused FEVER:  101 F or higher along with nausea, vomiting, gas pain, diarrhea UNABLE TO:  Pass gas from rectum for more than 24 hours  Tolerate liquids by mouth for more than 24 hours        Laparoscopic Hysterectomy, Care After Refer to this sheet in the next few weeks. These instructions provide you with information on caring for yourself after your procedure. Your health care provider may also give you more specific instructions. Your treatment has been planned according to current medical practices, but problems sometimes occur. Call your health care provider if you have any problems or questions after your procedure. What can I expect after the procedure?  Pain and bruising at the incision sites. You will be given pain medicine to control it.  Menopausal symptoms such as hot flashes, night sweats, and insomnia if your ovaries were removed.  Sore throat from the breathing tube that was inserted during surgery. Follow these instructions at home:  Only take over-the-counter or prescription medicines for pain, discomfort, or fever as directed by your health care provider.  Do not take aspirin. It can cause bleeding.  Do not drive when taking pain medicine.  Follow your health care provider's advice regarding diet, exercise, lifting, driving, and general activities.    Resume your usual diet as directed and allowed.  Get plenty of rest and sleep.  Do not douche, use tampons, or have sexual intercourse for at least 6 weeks, or until your health care provider gives you permission.  Change your  bandages (dressings) as directed by your health care provider.  Monitor your temperature and notify your health care provider of a fever.  Take showers instead of baths for 2-3 weeks.  Do not drink alcohol until your health care provider gives you permission.  If you develop constipation, you may take a mild laxative with your health care provider's permission. Bran foods may help with constipation problems. Drinking enough fluids to keep your urine clear or pale yellow may help as well.  Try to have someone home with you for 1-2 weeks to help around the house.  Keep all of your follow-up appointments as directed by your health care provider. Contact a health care provider if:  You have swelling, redness, or increasing pain around your incision sites.  You have pus coming from your incision.  You notice a bad smell coming from your incision.  Your incision breaks open.  You feel dizzy or lightheaded.  You have pain or bleeding when you urinate.  You have persistent diarrhea.  You have persistent nausea and vomiting.  You have abnormal vaginal discharge.  You have a rash.  You have any type of abnormal reaction or develop an allergy to your medicine.  You have poor pain control with your prescribed medicine. Get help right away if:  You have chest pain or shortness of breath.  You have severe abdominal pain that is not relieved with pain medicine.  You have pain or swelling in your legs. This information is not intended to replace advice given to you by your health care provider. Make sure you discuss any questions you have with your health care provider. Document Released: 04/28/2013 Document Revised: 12/14/2015 Document Reviewed: 01/26/2013 Elsevier Interactive Patient Education  2017 [employer]                    

## 2019-01-13 NOTE — Progress Notes (Signed)
Pre and post operative teaching completed. Provided copy of teaching in AVS. Awaiting OR date for posting.

## 2019-01-13 NOTE — Progress Notes (Signed)
PT STATES THAT she has heavy bleeding and with clots now since the bx. She has cramping pain

## 2019-01-13 NOTE — Progress Notes (Signed)
Gynecologic Oncology Consult Visit   Referring Provider: Dr. Ouida Sills  Chief Complaint: Endometrial Cancer, grade 1  Subjective:  Cassandra Sparks is a 48 y.o. G11P2 female who is seen in consultation from Dr. Ouida Sills for new diagnosis of endometrial cancer.   Patient initially presented as referral from PCP/Dr. Lennox Grumbles for spotting over past several months. She saw Dr. Ouida Sills on 01/04/2019. Pap was performed but results not yet available. Endometrial biopsy was performed which revealed:   Diagnosis:  Endometrium, Biopsy:  - well differentiated endometrioid carcinoma with mucinous differentiation (FIGO I)  She is post menopausal and does not desire fertility.  Still has some bleeding.  No other symptoms.  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   She has history of hyperthyroidism and takes Methimazole.  11/19: FTI 1.31 (high), TSH 0.01 (low)  Problem List: Patient Active Problem List   Diagnosis Date Noted  . Carpal tunnel syndrome 01/13/2019  . Endometrial cancer ([place]) 01/13/2019  . Hyperthyroidism 06/16/2018  . Bilateral foot pain 05/07/2016    Past Medical History: Past Medical History:  Diagnosis Date  . Anemia   . Hypertension   . Primary endometrioid carcinoma of endometrium of uterine body ([place])   . Thyroid disease     Past Surgical History: Past Surgical History:  Procedure Laterality Date  . BREAST BIOPSY Right 2013   neg  . DILATION AND CURETTAGE, DIAGNOSTIC / THERAPEUTIC      Past Gynecologic History:  Menarche: age 57 Periods last ~ 3 days Menses regular Post menopausal  Family History: Family History  Problem Relation Age of Onset  . Diabetes Mother   . Breast cancer Mother   . Heart attack Father   . Stroke Father      Social History: Social History   Socioeconomic History  . Marital status: Single    Spouse name: Not on file  . Number of children: Not on file  . Years of education: Not on file  . Highest education level: Not on  file  Occupational History  . Not on file  Social Needs  . Financial resource strain: Not on file  . Food insecurity    Worry: Not on file    Inability: Not on file  . Transportation needs    Medical: Not on file    Non-medical: Not on file  Tobacco Use  . Smoking status: Never Smoker  . Smokeless tobacco: Never Used  Substance and Sexual Activity  . Alcohol use: No    Alcohol/week: 0.0 standard drinks  . Drug use: Never  . Sexual activity: Yes  Lifestyle  . Physical activity    Days per week: Not on file    Minutes per session: Not on file  . Stress: Not on file  Relationships  . Social Herbalist on phone: Not on file    Gets together: Not on file    Attends religious service: Not on file    Active member of club or organization: Not on file    Attends meetings of clubs or organizations: Not on file    Relationship status: Not on file  . Intimate partner violence    Fear of current or ex partner: Not on file    Emotionally abused: Not on file    Physically abused: Not on file    Forced sexual activity: Not on file  Other Topics Concern  . Not on file  Social History Narrative  . Not on file    Allergies:  Allergies  Allergen Reactions  . Latex     Current Medications: Current Outpatient Medications  Medication Sig Dispense Refill  . Biotin 10000 MCG TABS Take 1 tablet by mouth daily.    Marland Kitchen docusate sodium (COLACE) 100 MG capsule Take 100 mg by mouth daily.    . ferrous sulfate (FEROSUL) 325 (65 FE) MG tablet Take 325 mg by mouth daily with breakfast.    . folic acid (FOLVITE) 161 MCG tablet Take 400 mcg by mouth daily.    . hydrochlorothiazide (HYDRODIURIL) 25 MG tablet Take 25 mg by mouth daily.   2  . lisinopril (PRINIVIL,ZESTRIL) 10 MG tablet   2  . methimazole (TAPAZOLE) 5 MG tablet Take 5 mg by mouth daily.    . metFORMIN (GLUCOPHAGE) 500 MG tablet Take 1,000 mg by mouth daily with breakfast.   2   No current facility-administered  medications for this visit.     Review of Systems General: negative for fevers, chills, fatigue, changes in sleep, changes in weight or appetite Skin: negative for changes in color, texture, moles or lesions Eyes: negative for changes in vision, pain, diplopia HEENT: negative for change in hearing, pain, discharge, tinnitus, vertigo, voice changes, sore throat, neck masses Breasts: negative for breast lumps Pulmonary: negative for dyspnea, orthopnea, productive cough Cardiac: negative for palpitations, syncope, pain, discomfort, pressure Gastrointestinal: negative for dysphagia, nausea, vomiting, jaundice, pain, constipation, diarrhea, hematemesis, hematochezia Genitourinary/Sexual: negative for dysuria, discharge, hesitancy, nocturia, retention, stones, infections, STD's, incontinence Ob/Gyn: negative for irregular bleeding, pain Musculoskeletal: negative for pain, stiffness, swelling, range of motion limitation Hematology: negative for easy bruising, bleeding Neurologic/Psych: negative for headaches, seizures, paralysis, weakness, tremor, change in gait, change in sensation, mood swings, depression, anxiety, change in memory   Objective:  Physical Examination:  BP 132/88   Pulse 80   Temp (!) 97.3 F (36.3 C) (Oral)   Resp 16   Ht 5\' 7"  (1.702 m)   Wt (!) 346 lb (156.9 kg)   BMI 54.19 kg/m     ECOG Performance Status: 1 - Symptomatic but completely ambulatory  GENERAL: Patient is a well appearing female in no acute distress HEENT:  Sclera clear. Anicteric NODES:  Negative axillary, supraclavicular, inguinal lymph node survery LUNGS:  Clear to auscultation bilaterally.   HEART:  Regular rate and rhythm.  ABDOMEN:  Soft, nontender.  No hernias. No masses or ascites EXTREMITIES:  No peripheral edema. Atraumatic. No cyanosis SKIN:  Clear with no obvious rashes or skin changes.  NEURO:  Nonfocal. Well oriented.  Appropriate affect.  Pelvic: EGBUS: no lesions Cervix: no  lesions, nontender, mobile Vagina: no lesions, no discharge.  Some blood seen. Uterus: normal size, nontender, mobile Adnexa: no palpable masses Rectovaginal: confirmatory      Assessment:  Cassandra Sparks is a 48 y.o. female diagnosed with grade 1 endometrial cancer. Already menopausal.    Medical co-morbidities complicating care: hyperthyroidism on methimazole, morbid obesity (BMI = 54), HTN, early AODM on metformin.  Plan:   Problem List Items Addressed This Visit      Genitourinary   Endometrial cancer ([place]) (Chronic)   Relevant Medications   folic acid (FOLVITE) 096 MCG tablet      We discussed options for management including TLH, BSO, SLN mapping and biopsies, possible pelvic/PA node dissection, possible X lap.  She will be scheduled for surgery next week or the week after.   The risks of surgery were discussed in detail and she understands these to include infection; wound separation;  hernia; vaginal cuff separation, injury to adjacent organs such as bowel, bladder, blood vessels, ureters and nerves; bleeding which may require blood transfusion; anesthesia risk; thromboembolic events; possible death; unforeseen complications; possible need for re-exploration; medical complications such as heart attack, stroke, pleural effusion and pneumonia; and, if staging performed the risk of lymphedema and lymphocyst.  The patient will receive DVT and antibiotic prophylaxis as indicated.  She voiced a clear understanding.  She had the opportunity to ask questions and written informed consent was obtained today.  Will need to recheck thyroid function tests to make sure they are acceptable for surgery.  If not will contact her primary care.   The patient's diagnosis, an outline of the further diagnostic and laboratory studies which will be required, the recommendation for surgery, and alternatives were discussed with her and her accompanying family members.  All questions were answered to their  satisfaction.  A total of 60 minutes were spent with the patient/family today; 40 % was spent in education, counseling and coordination of care for endometrial cancer.    Mellody Drown, MD   CC:  Schermerhorn, Gwen Her, MD [address] [facility] [place],  [place] [address] [phone]

## 2019-01-14 ENCOUNTER — Other Ambulatory Visit: Payer: Self-pay

## 2019-01-14 ENCOUNTER — Telehealth: Payer: Self-pay

## 2019-01-14 ENCOUNTER — Encounter: Payer: Self-pay | Admitting: Nurse Practitioner

## 2019-01-14 ENCOUNTER — Inpatient Hospital Stay: Payer: 59

## 2019-01-14 DIAGNOSIS — C541 Malignant neoplasm of endometrium: Secondary | ICD-10-CM | POA: Diagnosis not present

## 2019-01-14 DIAGNOSIS — Z6841 Body Mass Index (BMI) 40.0 and over, adult: Secondary | ICD-10-CM | POA: Diagnosis not present

## 2019-01-14 DIAGNOSIS — Z79899 Other long term (current) drug therapy: Secondary | ICD-10-CM | POA: Diagnosis not present

## 2019-01-14 DIAGNOSIS — I10 Essential (primary) hypertension: Secondary | ICD-10-CM | POA: Diagnosis not present

## 2019-01-14 DIAGNOSIS — E0590 Thyrotoxicosis, unspecified without thyrotoxic crisis or storm: Secondary | ICD-10-CM | POA: Diagnosis not present

## 2019-01-14 DIAGNOSIS — E119 Type 2 diabetes mellitus without complications: Secondary | ICD-10-CM | POA: Diagnosis not present

## 2019-01-14 LAB — T4, FREE: Free T4: 1.69 ng/dL — ABNORMAL HIGH (ref 0.61–1.12)

## 2019-01-14 LAB — TSH: TSH: 1.431 u[IU]/mL (ref 0.350–4.500)

## 2019-01-14 NOTE — Telephone Encounter (Signed)
Called and notified Cassandra Sparks, with her PAT appointment details. PAT appointment is scheduled for 01/15/19 at 0730, in the [place], [address]. She was directed to check in at the medical mall entrance and they will direct her to the [facility]. Once this is completed she will then proceed to the [facility] drive thru [id number] testing site. I asked her to inquire with them regarding quarantine after testing.

## 2019-01-14 NOTE — Progress Notes (Signed)
Surgery booking request faxed to OR scheduling with confirmation of receipt. Copy of consent faxed to PAT with confirmation of receipt. Original consent sent to medical records for filing. Surgery posted for 7/1 with Dr. Cristopher Estimable. Awaiting PAT appointment.

## 2019-01-15 ENCOUNTER — Encounter
Admission: RE | Admit: 2019-01-15 | Discharge: 2019-01-15 | Disposition: A | Discharge status: Home / Self Care | Payer: 59 | Source: Ambulatory Visit | Attending: Obstetrics & Gynecology | Admitting: Obstetrics & Gynecology

## 2019-01-15 ENCOUNTER — Other Ambulatory Visit: Payer: Self-pay

## 2019-01-15 DIAGNOSIS — Z01810 Encounter for preprocedural cardiovascular examination: Secondary | ICD-10-CM | POA: Diagnosis not present

## 2019-01-15 DIAGNOSIS — C541 Malignant neoplasm of endometrium: Secondary | ICD-10-CM

## 2019-01-15 DIAGNOSIS — Z1159 Encounter for screening for other viral diseases: Secondary | ICD-10-CM | POA: Diagnosis not present

## 2019-01-15 DIAGNOSIS — Z01818 Encounter for other preprocedural examination: Secondary | ICD-10-CM | POA: Diagnosis not present

## 2019-01-15 HISTORY — DX: Type 2 diabetes mellitus without complications: E11.9

## 2019-01-15 HISTORY — DX: Thyrotoxicosis, unspecified without thyrotoxic crisis or storm: E05.90

## 2019-01-15 LAB — CBC WITH DIFFERENTIAL/PLATELET
Abs Immature Granulocytes: 0.03 10*3/uL (ref 0.00–0.07)
Basophils Absolute: 0 10*3/uL (ref 0.0–0.1)
Basophils Relative: 1 %
Eosinophils Absolute: 0.3 10*3/uL (ref 0.0–0.5)
Eosinophils Relative: 4 %
HCT: 36.6 % (ref 36.0–46.0)
Hemoglobin: 10.9 g/dL — ABNORMAL LOW (ref 12.0–15.0)
Immature Granulocytes: 1 %
Lymphocytes Relative: 30 %
Lymphs Abs: 1.9 10*3/uL (ref 0.7–4.0)
MCH: 23.9 pg — ABNORMAL LOW (ref 26.0–34.0)
MCHC: 29.8 g/dL — ABNORMAL LOW (ref 30.0–36.0)
MCV: 80.3 fL (ref 80.0–100.0)
Monocytes Absolute: 0.5 10*3/uL (ref 0.1–1.0)
Monocytes Relative: 7 %
Neutro Abs: 3.6 10*3/uL (ref 1.7–7.7)
Neutrophils Relative %: 57 %
Platelets: 326 10*3/uL (ref 150–400)
RBC: 4.56 MIL/uL (ref 3.87–5.11)
RDW: 14.3 % (ref 11.5–15.5)
WBC: 6.3 10*3/uL (ref 4.0–10.5)
nRBC: 0 % (ref 0.0–0.2)

## 2019-01-15 LAB — COMPREHENSIVE METABOLIC PANEL
ALT: 16 U/L (ref 0–44)
AST: 18 U/L (ref 15–41)
Albumin: 3.5 g/dL (ref 3.5–5.0)
Alkaline Phosphatase: 93 U/L (ref 38–126)
Anion gap: 9 (ref 5–15)
BUN: 15 mg/dL (ref 6–20)
CO2: 28 mmol/L (ref 22–32)
Calcium: 8.3 mg/dL — ABNORMAL LOW (ref 8.9–10.3)
Chloride: 103 mmol/L (ref 98–111)
Creatinine, Ser: 0.73 mg/dL (ref 0.44–1.00)
GFR calc Af Amer: >60 mL/min (ref >60)
GFR calc non Af Amer: >60 mL/min (ref >60)
Glucose, Bld: 102 mg/dL — ABNORMAL HIGH (ref 70–99)
Potassium: 3.4 mmol/L — ABNORMAL LOW (ref 3.5–5.1)
Sodium: 140 mmol/L (ref 135–145)
Total Bilirubin: 0.4 mg/dL (ref 0.3–1.2)
Total Protein: 6.9 g/dL (ref 6.5–8.1)

## 2019-01-15 LAB — T3: T3, Total: 156 ng/dL (ref 71–180)

## 2019-01-15 LAB — TYPE AND SCREEN
ABO/RH(D): O NEG
Antibody Screen: NEGATIVE

## 2019-01-15 MED ORDER — CHLORHEXIDINE GLUCONATE CLOTH 2 % EX PADS
6.0000 | MEDICATED_PAD | Freq: Once | CUTANEOUS | Status: DC
  Filled 2019-01-15: qty 6

## 2019-01-15 MED ORDER — FLEET ENEMA 7-19 GM/118ML RE ENEM
1.0000 | ENEMA | Freq: Once | RECTAL | Status: DC
  Filled 2019-01-15: qty 1

## 2019-01-15 NOTE — Patient Instructions (Addendum)
Your procedure is scheduled on: 01/20/2019 Wed Report to Same Day Surgery 2nd floor medical mall [facility] Entrance-take elevator on left to 2nd floor.  Check in with surgery information desk.) To find out your arrival time please call [phone] between 1PM - 3PM on 01/19/2019 Tues  Remember: Instructions that are not followed completely may result in serious medical risk, up to and including death, or upon the discretion of your surgeon and anesthesiologist your surgery may need to be rescheduled.    _x___ 1. Do not eat food after midnight the night before your procedure. You may drink clear liquids up to 2 hours before you are scheduled to arrive at the hospital for your procedure.  Do not drink clear liquids within 2 hours of your scheduled arrival to the hospital.  Clear liquids include  --Water or Apple juice without pulp  --Clear carbohydrate beverage such as ClearFast or Gatorade  --Black Coffee or Clear Tea (No milk, no creamers, do not add anything to                  the coffee or Tea Type 1 and type 2 diabetics should only drink water.   ____Ensure clear carbohydrate drink on the way to the hospital for bariatric patients  ____Ensure clear carbohydrate drink 3 hours before surgery for Dr Dwyane Luo patients if physician instructed.   No gum chewing or hard candies.     __x__ 2. No Alcohol for 24 hours before or after surgery.   __x__3. No Smoking or e-cigarettes for 24 prior to surgery.  Do not use any chewable tobacco products for at least 6 hour prior to surgery   ____  4. Bring all medications with you on the day of surgery if instructed.    __x__ 5. Notify your doctor if there is any change in your medical condition     (cold, fever, infections).    x___6. On the morning of surgery brush your teeth with toothpaste and water.  You may rinse your mouth with mouth wash if you wish.  Do not swallow any toothpaste or mouthwash.   Do not wear jewelry, make-up, hairpins,  clips or nail polish.  Do not wear lotions, powders, or perfumes. You may wear deodorant.  Do not shave 48 hours prior to surgery. Men may shave face and neck.  Do not bring valuables to the hospital.    [facility] is not responsible for any belongings or valuables.               Contacts, dentures or bridgework may not be worn into surgery.  Leave your suitcase in the car. After surgery it may be brought to your room.  For patients admitted to the hospital, discharge time is determined by your                       treatment team.  _  Patients discharged the day of surgery will not be allowed to drive home.  You will need someone to drive you home and stay with you the night of your procedure.    Please read over the following fact sheets that you were given:   [facility] Preparing for Surgery and or MRSA Information   _x___ Take anti-hypertensive listed below, cardiac, seizure, asthma,     anti-reflux and psychiatric medicines. These include:  1. methimazole (TAPAZOLE) 5 MG tablet  2.  3.  4.  5.  6.  __x__Fleets enema or  Magnesium Citrate as directed.   _x___ Use CHG Soap or sage wipes as directed on instruction sheet   ____ Use inhalers on the day of surgery and bring to hospital day of surgery  _x___ Stop Metformin and Janumet 2 days prior to surgery.    ____ Take 1/2 of usual insulin dose the night before surgery and none on the morning     surgery.   _x___ Follow recommendations from Cardiologist, Pulmonologist or PCP regarding          stopping Aspirin, Coumadin, Plavix ,Eliquis, Effient, or Pradaxa, and Pletal.  X____Stop Anti-inflammatories such as Advil, Aleve, Ibuprofen, Motrin, Naproxen, Naprosyn, Goodies powders or aspirin products. OK to take Tylenol and                          Celebrex.   _x___ Stop supplements until after surgery.  But may continue Vitamin D, Vitamin B,       and multivitamin.   ____ Bring C-Pap to the hospital.

## 2019-01-15 NOTE — Pre-Procedure Instructions (Signed)
Fleets enema given along with instructions.

## 2019-01-16 LAB — NOVEL CORONAVIRUS, NAA (HOSP ORDER, SEND-OUT TO REF LAB; TAT 18-24 HRS): SARS-CoV-2, NAA: NOT DETECTED

## 2019-01-18 ENCOUNTER — Telehealth: Payer: Self-pay | Admitting: Nurse Practitioner

## 2019-01-18 DIAGNOSIS — C541 Malignant neoplasm of endometrium: Secondary | ICD-10-CM | POA: Diagnosis not present

## 2019-01-18 NOTE — Telephone Encounter (Signed)
Call back from Dr. Honor Junes. He has not seen patient since 05/2018 and does not feel comfortable giving clearance for surgery without seeing patient. He recommended getting clearance from PCP. I reached out to Dr. Lennox Grumbles who says labs are acceptable for planned surgery and slight elevation in FT4 consistent with her receiving exogenous thyroid hormone.  I called patient to update her and reviewed her labs.

## 2019-01-18 NOTE — Telephone Encounter (Signed)
Spoke to [facility] in Dr. Sherren Mocha office (endocrinology) regarding labs (TSH, T3, FT4) and planned surgery on 7/1. I have spoken to anesthesia who cleared for surgery based on ok from Dr. Honor Junes. Awaiting Dr. Sherren Mocha response.

## 2019-01-19 MED ORDER — DEXTROSE 5 % IV SOLN
3.0000 g | INTRAVENOUS | Status: AC
  Administered 2019-01-20: 3 g via INTRAVENOUS
  Filled 2019-01-19: qty 3

## 2019-01-20 ENCOUNTER — Observation Stay
Admission: RE | Admit: 2019-01-20 | Discharge: 2019-01-21 | Disposition: A | Discharge status: Home / Self Care | Payer: 59 | Source: Ambulatory Visit | Attending: Obstetrics & Gynecology | Admitting: Obstetrics & Gynecology

## 2019-01-20 ENCOUNTER — Other Ambulatory Visit: Payer: Self-pay

## 2019-01-20 ENCOUNTER — Encounter: Payer: Self-pay | Admitting: *Deleted

## 2019-01-20 ENCOUNTER — Ambulatory Visit: Payer: 59 | Admitting: Anesthesiology

## 2019-01-20 ENCOUNTER — Encounter
Admission: RE | Disposition: A | Discharge status: Home / Self Care | Payer: Self-pay | Source: Ambulatory Visit | Attending: Obstetrics & Gynecology

## 2019-01-20 DIAGNOSIS — I10 Essential (primary) hypertension: Secondary | ICD-10-CM | POA: Diagnosis not present

## 2019-01-20 DIAGNOSIS — Z7984 Long term (current) use of oral hypoglycemic drugs: Secondary | ICD-10-CM | POA: Diagnosis not present

## 2019-01-20 DIAGNOSIS — E119 Type 2 diabetes mellitus without complications: Secondary | ICD-10-CM | POA: Diagnosis not present

## 2019-01-20 DIAGNOSIS — K660 Peritoneal adhesions (postprocedural) (postinfection): Secondary | ICD-10-CM | POA: Diagnosis not present

## 2019-01-20 DIAGNOSIS — D259 Leiomyoma of uterus, unspecified: Secondary | ICD-10-CM | POA: Diagnosis not present

## 2019-01-20 DIAGNOSIS — Z79899 Other long term (current) drug therapy: Secondary | ICD-10-CM | POA: Insufficient documentation

## 2019-01-20 DIAGNOSIS — D649 Anemia, unspecified: Secondary | ICD-10-CM | POA: Diagnosis not present

## 2019-01-20 DIAGNOSIS — C541 Malignant neoplasm of endometrium: Secondary | ICD-10-CM | POA: Diagnosis not present

## 2019-01-20 DIAGNOSIS — Z6841 Body Mass Index (BMI) 40.0 and over, adult: Secondary | ICD-10-CM | POA: Diagnosis not present

## 2019-01-20 DIAGNOSIS — C55 Malignant neoplasm of uterus, part unspecified: Secondary | ICD-10-CM | POA: Diagnosis not present

## 2019-01-20 DIAGNOSIS — Z9889 Other specified postprocedural states: Secondary | ICD-10-CM

## 2019-01-20 HISTORY — PX: ROBOTIC ASSISTED TOTAL HYSTERECTOMY WITH BILATERAL SALPINGO OOPHERECTOMY: SHX6086

## 2019-01-20 LAB — ABO/RH: ABO/RH(D): O NEG

## 2019-01-20 LAB — GLUCOSE, CAPILLARY
Glucose-Capillary: 81 mg/dL (ref 70–99)
Glucose-Capillary: 155 mg/dL — ABNORMAL HIGH (ref 70–99)
Glucose-Capillary: 100 mg/dL — ABNORMAL HIGH (ref 70–99)

## 2019-01-20 SURGERY — ROBOTIC ASSISTED TOTAL HYSTERECTOMY WITH BILATERAL SALPINGO OOPHORECTOMY
Anesthesia: General

## 2019-01-20 MED ORDER — DEXAMETHASONE SODIUM PHOSPHATE 10 MG/ML IJ SOLN
INTRAMUSCULAR | Status: DC | PRN
  Administered 2019-01-20: 10 mg via INTRAVENOUS

## 2019-01-20 MED ORDER — ONDANSETRON HCL 4 MG/2ML IJ SOLN
INTRAMUSCULAR | Status: AC
  Filled 2019-01-20: qty 2

## 2019-01-20 MED ORDER — CELECOXIB 200 MG PO CAPS
400.0000 mg | ORAL_CAPSULE | Freq: Once | ORAL | Status: AC
  Administered 2019-01-20: 400 mg via ORAL

## 2019-01-20 MED ORDER — HEPARIN SODIUM (PORCINE) 5000 UNIT/ML IJ SOLN
INTRAMUSCULAR | Status: AC
  Administered 2019-01-20: 5000 [IU] via SUBCUTANEOUS
  Filled 2019-01-20: qty 1

## 2019-01-20 MED ORDER — SUGAMMADEX SODIUM 200 MG/2ML IV SOLN
INTRAVENOUS | Status: AC
  Filled 2019-01-20: qty 2

## 2019-01-20 MED ORDER — LACTATED RINGERS IV SOLN: INTRAVENOUS | Status: DC

## 2019-01-20 MED ORDER — CELECOXIB 200 MG PO CAPS
ORAL_CAPSULE | ORAL | Status: AC
  Filled 2019-01-20: qty 2

## 2019-01-20 MED ORDER — PROPOFOL 10 MG/ML IV BOLUS
INTRAVENOUS | Status: DC | PRN
  Administered 2019-01-20: 30 mg via INTRAVENOUS
  Administered 2019-01-20: 50 mg via INTRAVENOUS
  Administered 2019-01-20: 200 mg via INTRAVENOUS
  Administered 2019-01-20 (×2): 30 mg via INTRAVENOUS

## 2019-01-20 MED ORDER — FAMOTIDINE 20 MG PO TABS
20.0000 mg | ORAL_TABLET | Freq: Once | ORAL | Status: AC
  Administered 2019-01-20: 08:00:00 20 mg via ORAL

## 2019-01-20 MED ORDER — SODIUM CHLORIDE 0.9 % IV SOLN
INTRAVENOUS | Status: DC | PRN
  Administered 2019-01-20 (×2): via INTRAVENOUS

## 2019-01-20 MED ORDER — PROMETHAZINE HCL 25 MG/ML IJ SOLN: 6.2500 mg | INTRAMUSCULAR | Status: DC | PRN

## 2019-01-20 MED ORDER — INDOCYANINE GREEN 25 MG IV SOLR
INTRAVENOUS | Status: AC
  Filled 2019-01-20: qty 25

## 2019-01-20 MED ORDER — PHENYLEPHRINE HCL (PRESSORS) 10 MG/ML IV SOLN
INTRAVENOUS | Status: DC | PRN
  Administered 2019-01-20 (×2): 200 ug via INTRAVENOUS
  Administered 2019-01-20: 100 ug via INTRAVENOUS
  Administered 2019-01-20: 200 ug via INTRAVENOUS

## 2019-01-20 MED ORDER — FENTANYL CITRATE (PF) 100 MCG/2ML IJ SOLN
INTRAMUSCULAR | Status: AC
  Filled 2019-01-20: qty 2

## 2019-01-20 MED ORDER — GLYCOPYRROLATE 0.2 MG/ML IJ SOLN
INTRAMUSCULAR | Status: DC | PRN
  Administered 2019-01-20: 0.2 mg via INTRAVENOUS

## 2019-01-20 MED ORDER — FAMOTIDINE 20 MG PO TABS
ORAL_TABLET | ORAL | Status: AC
  Administered 2019-01-20: 20 mg via ORAL
  Filled 2019-01-20: qty 1

## 2019-01-20 MED ORDER — LIDOCAINE HCL (PF) 2 % IJ SOLN
INTRAMUSCULAR | Status: AC
  Filled 2019-01-20: qty 10

## 2019-01-20 MED ORDER — FENTANYL CITRATE (PF) 100 MCG/2ML IJ SOLN
25.0000 ug | INTRAMUSCULAR | Status: DC | PRN
  Administered 2019-01-20 (×4): 25 ug via INTRAVENOUS

## 2019-01-20 MED ORDER — FENTANYL CITRATE (PF) 250 MCG/5ML IJ SOLN
INTRAMUSCULAR | Status: AC
  Filled 2019-01-20: qty 5

## 2019-01-20 MED ORDER — SUCCINYLCHOLINE CHLORIDE 20 MG/ML IJ SOLN
INTRAMUSCULAR | Status: AC
  Filled 2019-01-20: qty 1

## 2019-01-20 MED ORDER — ROCURONIUM BROMIDE 100 MG/10ML IV SOLN
INTRAVENOUS | Status: DC | PRN
  Administered 2019-01-20: 30 mg via INTRAVENOUS
  Administered 2019-01-20: 10 mg via INTRAVENOUS
  Administered 2019-01-20: 20 mg via INTRAVENOUS
  Administered 2019-01-20: 30 mg via INTRAVENOUS
  Administered 2019-01-20: 40 mg via INTRAVENOUS
  Administered 2019-01-20: 30 mg via INTRAVENOUS

## 2019-01-20 MED ORDER — GABAPENTIN 300 MG PO CAPS
ORAL_CAPSULE | ORAL | Status: AC
  Filled 2019-01-20: qty 2

## 2019-01-20 MED ORDER — ONDANSETRON HCL 4 MG/2ML IJ SOLN
INTRAMUSCULAR | Status: DC | PRN
  Administered 2019-01-20: 4 mg via INTRAVENOUS

## 2019-01-20 MED ORDER — FUROSEMIDE 10 MG/ML IJ SOLN
INTRAMUSCULAR | Status: AC
  Administered 2019-01-20: 20 mg via INTRAVENOUS
  Filled 2019-01-20: qty 2

## 2019-01-20 MED ORDER — HEPARIN SODIUM (PORCINE) 5000 UNIT/ML IJ SOLN
5000.0000 [IU] | Freq: Once | INTRAMUSCULAR | Status: AC
  Administered 2019-01-20: 5000 [IU] via SUBCUTANEOUS

## 2019-01-20 MED ORDER — ACETAMINOPHEN 500 MG PO TABS
1000.0000 mg | ORAL_TABLET | ORAL | Status: AC
  Administered 2019-01-20: 08:00:00 1000 mg via ORAL

## 2019-01-20 MED ORDER — EPHEDRINE SULFATE 50 MG/ML IJ SOLN
INTRAMUSCULAR | Status: DC | PRN
  Administered 2019-01-20: 10 mg via INTRAVENOUS

## 2019-01-20 MED ORDER — EPHEDRINE SULFATE 50 MG/ML IJ SOLN
INTRAMUSCULAR | Status: AC
  Filled 2019-01-20: qty 1

## 2019-01-20 MED ORDER — LIDOCAINE HCL (CARDIAC) PF 100 MG/5ML IV SOSY
PREFILLED_SYRINGE | INTRAVENOUS | Status: DC | PRN
  Administered 2019-01-20: 100 mg via INTRAVENOUS

## 2019-01-20 MED ORDER — DEXAMETHASONE SODIUM PHOSPHATE 10 MG/ML IJ SOLN
INTRAMUSCULAR | Status: AC
  Filled 2019-01-20: qty 1

## 2019-01-20 MED ORDER — SUGAMMADEX SODIUM 500 MG/5ML IV SOLN
INTRAVENOUS | Status: DC | PRN
  Administered 2019-01-20: 150 mg via INTRAVENOUS
  Administered 2019-01-20: 500 mg via INTRAVENOUS

## 2019-01-20 MED ORDER — ROCURONIUM BROMIDE 50 MG/5ML IV SOLN
INTRAVENOUS | Status: AC
  Filled 2019-01-20: qty 3

## 2019-01-20 MED ORDER — HEMOSTATIC AGENTS (NO CHARGE) OPTIME
TOPICAL | Status: DC | PRN
  Administered 2019-01-20: 1 via TOPICAL

## 2019-01-20 MED ORDER — DEXMEDETOMIDINE HCL IN NACL 200 MCG/50ML IV SOLN
INTRAVENOUS | Status: DC | PRN
  Administered 2019-01-20: 4 ug via INTRAVENOUS
  Administered 2019-01-20 (×2): 8 ug via INTRAVENOUS
  Administered 2019-01-20: 4 ug via INTRAVENOUS

## 2019-01-20 MED ORDER — SODIUM CHLORIDE FLUSH 0.9 % IV SOLN
INTRAVENOUS | Status: AC
  Filled 2019-01-20: qty 10

## 2019-01-20 MED ORDER — BUPIVACAINE HCL (PF) 0.5 % IJ SOLN
INTRAMUSCULAR | Status: DC | PRN
  Administered 2019-01-20: 9 mL
  Administered 2019-01-20: 19 mL

## 2019-01-20 MED ORDER — OXYCODONE HCL 5 MG PO TABS: 5.0000 mg | ORAL_TABLET | Freq: Once | ORAL | Status: DC | PRN

## 2019-01-20 MED ORDER — SODIUM CHLORIDE 0.9 % IV SOLN
INTRAVENOUS | Status: DC | PRN
  Administered 2019-01-20: 20 ug/min via INTRAVENOUS

## 2019-01-20 MED ORDER — SCOPOLAMINE 1 MG/3DAYS TD PT72
1.0000 | MEDICATED_PATCH | TRANSDERMAL | Status: DC
  Administered 2019-01-20: 1.5 mg via TRANSDERMAL

## 2019-01-20 MED ORDER — ACETAMINOPHEN 500 MG PO TABS
ORAL_TABLET | ORAL | Status: AC
  Administered 2019-01-20: 08:00:00 1000 mg via ORAL
  Filled 2019-01-20: qty 2

## 2019-01-20 MED ORDER — FUROSEMIDE 10 MG/ML IJ SOLN
20.0000 mg | Freq: Once | INTRAMUSCULAR | Status: AC
  Administered 2019-01-20: 20 mg via INTRAVENOUS

## 2019-01-20 MED ORDER — FENTANYL CITRATE (PF) 100 MCG/2ML IJ SOLN
INTRAMUSCULAR | Status: AC
  Administered 2019-01-20: 25 ug via INTRAVENOUS
  Filled 2019-01-20: qty 2

## 2019-01-20 MED ORDER — OXYCODONE HCL 5 MG/5ML PO SOLN: 5.0000 mg | Freq: Once | ORAL | Status: DC | PRN

## 2019-01-20 MED ORDER — SUCCINYLCHOLINE CHLORIDE 20 MG/ML IJ SOLN
INTRAMUSCULAR | Status: DC | PRN
  Administered 2019-01-20: 160 mg via INTRAVENOUS

## 2019-01-20 MED ORDER — KETOROLAC TROMETHAMINE 30 MG/ML IJ SOLN
30.0000 mg | Freq: Four times a day (QID) | INTRAMUSCULAR | Status: DC
  Administered 2019-01-20 – 2019-01-21 (×3): 30 mg via INTRAVENOUS
  Filled 2019-01-20 (×3): qty 1

## 2019-01-20 MED ORDER — ACETAMINOPHEN 650 MG RE SUPP: 650.0000 mg | RECTAL | Status: DC | PRN

## 2019-01-20 MED ORDER — FENTANYL CITRATE (PF) 100 MCG/2ML IJ SOLN
INTRAMUSCULAR | Status: DC | PRN
  Administered 2019-01-20 (×3): 50 ug via INTRAVENOUS
  Administered 2019-01-20: 100 ug via INTRAVENOUS
  Administered 2019-01-20 (×3): 50 ug via INTRAVENOUS

## 2019-01-20 MED ORDER — PROPOFOL 500 MG/50ML IV EMUL
INTRAVENOUS | Status: AC
  Filled 2019-01-20: qty 50

## 2019-01-20 MED ORDER — INDOCYANINE GREEN 25 MG IV SOLR
INTRAVENOUS | Status: DC | PRN
  Administered 2019-01-20: 11:00:00 25 mg

## 2019-01-20 MED ORDER — SODIUM CHLORIDE 0.9 % IV SOLN
INTRAVENOUS | Status: DC
  Administered 2019-01-20 (×2): via INTRAVENOUS

## 2019-01-20 MED ORDER — ROCURONIUM BROMIDE 50 MG/5ML IV SOLN
INTRAVENOUS | Status: AC
  Filled 2019-01-20: qty 1

## 2019-01-20 MED ORDER — DEXMEDETOMIDINE HCL IN NACL 80 MCG/20ML IV SOLN
INTRAVENOUS | Status: AC
  Filled 2019-01-20: qty 20

## 2019-01-20 MED ORDER — KETOROLAC TROMETHAMINE 30 MG/ML IJ SOLN
INTRAMUSCULAR | Status: AC
  Administered 2019-01-20: 15:00:00 30 mg via INTRAVENOUS
  Filled 2019-01-20: qty 1

## 2019-01-20 MED ORDER — MORPHINE SULFATE (PF) 2 MG/ML IV SOLN
1.0000 mg | INTRAVENOUS | Status: DC | PRN
  Administered 2019-01-20: 1 mg via INTRAVENOUS
  Filled 2019-01-20: qty 1

## 2019-01-20 MED ORDER — MIDAZOLAM HCL 2 MG/2ML IJ SOLN
INTRAMUSCULAR | Status: DC | PRN
  Administered 2019-01-20: 2 mg via INTRAVENOUS

## 2019-01-20 MED ORDER — MIDAZOLAM HCL 2 MG/2ML IJ SOLN
INTRAMUSCULAR | Status: AC
  Filled 2019-01-20: qty 2

## 2019-01-20 MED ORDER — PHENYLEPHRINE HCL (PRESSORS) 10 MG/ML IV SOLN
INTRAVENOUS | Status: AC
  Filled 2019-01-20: qty 1

## 2019-01-20 MED ORDER — BUPIVACAINE HCL (PF) 0.5 % IJ SOLN
INTRAMUSCULAR | Status: AC
  Filled 2019-01-20: qty 30

## 2019-01-20 MED ORDER — MEPERIDINE HCL 50 MG/ML IJ SOLN: 6.2500 mg | INTRAMUSCULAR | Status: DC | PRN

## 2019-01-20 MED ORDER — ACETAMINOPHEN 325 MG PO TABS
650.0000 mg | ORAL_TABLET | ORAL | Status: DC | PRN
  Administered 2019-01-20 – 2019-01-21 (×3): 650 mg via ORAL
  Filled 2019-01-20 (×3): qty 2

## 2019-01-20 MED ORDER — SCOPOLAMINE 1 MG/3DAYS TD PT72
MEDICATED_PATCH | TRANSDERMAL | Status: AC
  Filled 2019-01-20: qty 1

## 2019-01-20 MED ORDER — SUGAMMADEX SODIUM 500 MG/5ML IV SOLN
INTRAVENOUS | Status: AC
  Filled 2019-01-20: qty 5

## 2019-01-20 MED ORDER — GABAPENTIN 300 MG PO CAPS
600.0000 mg | ORAL_CAPSULE | Freq: Once | ORAL | Status: AC
  Administered 2019-01-20: 600 mg via ORAL

## 2019-01-20 SURGICAL SUPPLY — 83 items
ADH SKN CLS APL DERMABOND .7 (GAUZE/BANDAGES/DRESSINGS) ×1
APL PRP STRL LF DISP 70% ISPRP (MISCELLANEOUS) ×1
BAG URINE DRAINAGE (UROLOGICAL SUPPLIES) ×3 IMPLANT
BLADE SURG SZ11 CARB STEEL (BLADE) ×3 IMPLANT
CANISTER SUCT 1200ML W/VALVE (MISCELLANEOUS) ×3 IMPLANT
CATH FOLEY 2WAY  5CC 16FR (CATHETERS) ×2
CATH FOLEY 2WAY 5CC 16FR (CATHETERS) ×1
CATH URTH 16FR FL 2W BLN LF (CATHETERS) ×1 IMPLANT
CHLORAPREP W/TINT 26 (MISCELLANEOUS) ×3 IMPLANT
CORD BIP STRL DISP 12FT (MISCELLANEOUS) ×3 IMPLANT
COVER TIP SHEARS 8 DVNC (MISCELLANEOUS) ×1 IMPLANT
COVER TIP SHEARS 8MM DA VINCI (MISCELLANEOUS) ×2
COVER WAND RF STERILE (DRAPES) ×3 IMPLANT
CRADLE LAMINECT ARM (MISCELLANEOUS) ×3 IMPLANT
DEFOGGER SCOPE WARMER CLEARIFY (MISCELLANEOUS) ×3 IMPLANT
DERMABOND ADVANCED (GAUZE/BANDAGES/DRESSINGS) ×2
DERMABOND ADVANCED .7 DNX12 (GAUZE/BANDAGES/DRESSINGS) ×1 IMPLANT
DRAPE ARM DVNC X/XI (DISPOSABLE) ×1 IMPLANT
DRAPE DA VINCI XI ARM (DISPOSABLE) ×2
DRAPE LEGGINS SURG 28X43 STRL (DRAPES) ×3 IMPLANT
DRAPE SHEET LG 3/4 BI-LAMINATE (DRAPES) ×5 IMPLANT
DRAPE UNDER BUTTOCK W/FLU (DRAPES) ×3 IMPLANT
DRESSING SURGICEL FIBRLLR 1X2 (HEMOSTASIS) IMPLANT
DRIVER NDL 23- D MEGA 49.7X1.4 (INSTRUMENTS) ×1 IMPLANT
DRIVER NDLE MEGA SUTCUT DVNC (INSTRUMENTS) ×1
DRSG SURGICEL FIBRILLAR 1X2 (HEMOSTASIS) ×2
DRVR NDL 23- D MEGA 49.7X1.4 (INSTRUMENTS) ×1
ELECT REM PT RETURN 9FT ADLT (ELECTROSURGICAL) ×3
ELECTRODE REM PT RTRN 9FT ADLT (ELECTROSURGICAL) ×1 IMPLANT
FILTER LAP SMOKE EVAC STRL (MISCELLANEOUS) ×3 IMPLANT
GLOVE INDICATOR 7.0 STRL GRN (GLOVE) ×14 IMPLANT
GLOVE SURG SYN 6.5 ES PF (GLOVE) ×24 IMPLANT
GLOVE SURG SYN 6.5 PF PI (GLOVE) ×8 IMPLANT
GOWN STRL REUS W/ TWL LRG LVL3 (GOWN DISPOSABLE) ×8 IMPLANT
GOWN STRL REUS W/TWL LRG LVL3 (GOWN DISPOSABLE) ×24
GRASPER SUT TROCAR 14GX15 (MISCELLANEOUS) ×3 IMPLANT
IRRIGATION STRYKERFLOW (MISCELLANEOUS) ×1 IMPLANT
IRRIGATOR STRYKERFLOW (MISCELLANEOUS) ×6
IV NS 1000ML (IV SOLUTION) ×3
IV NS 1000ML BAXH (IV SOLUTION) ×1 IMPLANT
KIT PINK PAD W/HEAD ARE REST (MISCELLANEOUS) ×3
KIT PINK PAD W/HEAD ARM REST (MISCELLANEOUS) ×1 IMPLANT
KIT TURNOVER CYSTO (KITS) ×3 IMPLANT
LABEL OR SOLS (LABEL) ×3 IMPLANT
LIGASURE VESSEL 5MM BLUNT TIP (ELECTROSURGICAL) ×2 IMPLANT
MANIPULATOR VCARE LG CRV RETR (MISCELLANEOUS) ×1 IMPLANT
MANIPULATOR VCARE STD CRV RETR (MISCELLANEOUS) ×3 IMPLANT
NDL INSUFF 14G 150MM VS150000 (NEEDLE) ×3 IMPLANT
NEEDLE HYPO 22GX1.5 SAFETY (NEEDLE) ×3 IMPLANT
NEEDLE VERESS 14GA 120MM (NEEDLE) ×3 IMPLANT
NS IRRIG 1000ML POUR BTL (IV SOLUTION) ×3 IMPLANT
OBTURATOR OPTICAL STANDARD 8MM (TROCAR) ×2
OBTURATOR OPTICAL STND 8 DVNC (TROCAR) ×1
OBTURATOR OPTICALSTD 8 DVNC (TROCAR) ×1 IMPLANT
OCCLUDER COLPOPNEUMO (BALLOONS) ×2 IMPLANT
PACK LAP CHOLECYSTECTOMY (MISCELLANEOUS) ×3 IMPLANT
PAD OB MATERNITY 4.3X12.25 (PERSONAL CARE ITEMS) ×3 IMPLANT
PAD PREP 24X41 OB/GYN DISP (PERSONAL CARE ITEMS) ×3 IMPLANT
PAD TELFA 2X3 NADH STRL (GAUZE/BANDAGES/DRESSINGS) ×2 IMPLANT
PENCIL ELECTRO HAND CTR (MISCELLANEOUS) ×3 IMPLANT
SOLUTION ELECTROLUBE (MISCELLANEOUS) ×3 IMPLANT
SUT CUT NEEDLE DRIVER (INSTRUMENTS) ×2
SUT DVC VLOC 180 0 12IN GS21 (SUTURE) ×3
SUT ETHIBOND NAB CT1 #1 30IN (SUTURE) ×1 IMPLANT
SUT MNCRL 4-0 (SUTURE) ×3
SUT MNCRL 4-0 27XMFL (SUTURE) ×1
SUT VIC AB 0 CT1 36 (SUTURE) ×5 IMPLANT
SUT VIC AB 0 UR5 27 (SUTURE) ×5 IMPLANT
SUT VIC AB 1 CT1 36 (SUTURE) ×5 IMPLANT
SUT VIC AB 2-0 CT1 27 (SUTURE) ×3
SUT VIC AB 2-0 CT1 TAPERPNT 27 (SUTURE) ×1 IMPLANT
SUT VIC AB 3-0 SH 27 (SUTURE) ×6
SUT VIC AB 3-0 SH 27X BRD (SUTURE) ×2 IMPLANT
SUT VICRYL 0 AB UR-6 (SUTURE) ×2 IMPLANT
SUTURE DVC VLC 180 0 12IN GS21 (SUTURE) ×1 IMPLANT
SUTURE MNCRL 4-0 27XMF (SUTURE) IMPLANT
SYR 10ML LL (SYRINGE) ×3 IMPLANT
SYR 50ML LL SCALE MARK (SYRINGE) ×2 IMPLANT
TOWEL OR 17X26 4PK STRL BLUE (TOWEL DISPOSABLE) ×2 IMPLANT
TRAP SPECIMEN MUCOUS 40CC (MISCELLANEOUS) ×2 IMPLANT
TROCAR BALLN GELPORT 12X130M (ENDOMECHANICALS) IMPLANT
TROCAR ENDO BLADELESS 11MM (ENDOMECHANICALS) ×1 IMPLANT
TUBING EVAC SMOKE HEATED PNEUM (TUBING) ×3 IMPLANT

## 2019-01-20 NOTE — Anesthesia Procedure Notes (Signed)
Procedure Name: Intubation Date/Time: 01/20/2019 8:47 AM Performed by: Lavone Orn, CRNA Pre-anesthesia Checklist: Patient identified, Emergency Drugs available, Suction available, Patient being monitored and Timeout performed Patient Re-evaluated:Patient Re-evaluated prior to induction Oxygen Delivery Method: Circle system utilized Preoxygenation: Pre-oxygenation with 100% oxygen Induction Type: IV induction Ventilation: Mask ventilation without difficulty and Oral airway inserted - appropriate to patient size Laryngoscope Size: McGraph and 4 Grade View: Grade II Tube type: Oral Tube size: 7.5 mm Number of attempts: 2 Airway Equipment and Method: Stylet and Video-laryngoscopy Placement Confirmation: ETT inserted through vocal cords under direct vision,  positive ETCO2 and breath sounds checked- equal and bilateral Secured at: 22 cm Tube secured with: Tape Dental Injury: Teeth and Oropharynx as per pre-operative assessment

## 2019-01-20 NOTE — H&P (Signed)
Preoperative History and Physical  Cassandra Sparks is a 48 y.o. here for surgical management of endometrial cancer.   No significant preoperative concerns.  Has h/o morbid obesity, hypertension, and diabetes.   Postmenopausal bleeding workup showed well differentiated endometroid adenocarcinoma   Proposed surgery: Robotic assisted total laparoscopic hysterectomy, bilateral salpingo-oophorectomy, sentinel pelvic lymph node mapping and biopsy.  Past Medical History:  Diagnosis Date  . Anemia   . Diabetes mellitus without complication ([place])   . Hypertension   . Hyperthyroidism   . Primary endometrioid carcinoma of endometrium of uterine body ([place])   . Thyroid disease    Past Surgical History:  Procedure Laterality Date  . BREAST BIOPSY Right 2013   neg  . DILATION AND CURETTAGE, DIAGNOSTIC / THERAPEUTIC    . ECTOPIC PREGNANCY SURGERY     OB History  No obstetric history on file.  Patient denies any other pertinent gynecologic issues.   No current facility-administered medications on file prior to encounter.    Current Outpatient Medications on File Prior to Encounter  Medication Sig Dispense Refill  . acetaminophen (TYLENOL) 325 MG tablet Take 650 mg by mouth every 6 (six) hours as needed (for pain.).    Marland Kitchen Biotin 10000 MCG TABS Take 10,000 mcg by mouth daily.     Marland Kitchen docusate sodium (COLACE) 100 MG capsule Take 100 mg by mouth daily.    . ferrous sulfate (FEROSUL) 325 (65 FE) MG tablet Take 325 mg by mouth daily with breakfast.    . folic acid (FOLVITE) 696 MCG tablet Take 400 mcg by mouth daily.    . hydrochlorothiazide (HYDRODIURIL) 25 MG tablet Take 25 mg by mouth daily.   2  . ibuprofen (ADVIL) 200 MG tablet Take 400 mg by mouth every 8 (eight) hours as needed (pain.).    Marland Kitchen lisinopril (PRINIVIL,ZESTRIL) 10 MG tablet Take 10 mg by mouth daily.   2  . metFORMIN (GLUCOPHAGE) 500 MG tablet Take 1,000 mg by mouth daily with breakfast.   2  . methimazole (TAPAZOLE) 5 MG tablet Take  5 mg by mouth daily.     Allergies  Allergen Reactions  . Latex Hives and Itching  . Fish Allergy Rash    Social History:   reports that she has never smoked. She has never used smokeless tobacco. She reports that she does not drink alcohol or use drugs.  Family History  Problem Relation Age of Onset  . Diabetes Mother   . Breast cancer Mother   . Heart attack Father   . Stroke Father     Review of Systems: Noncontributory  PHYSICAL EXAM:  -- see flowsheet -- General appearance - alert, well appearing, and in no distress Chest - clear to auscultation, no wheezes, rales or rhonchi, symmetric air entry Heart - normal rate and regular rhythm Abdomen - soft, nontender, nondistended, no masses or organomegaly Pelvic - examination not indicated Extremities - peripheral pulses normal, no pedal edema, no clubbing or cyanosis  Labs: Results for orders placed or performed during the hospital encounter of 01/20/19 (from the past 336 hour(s))  Glucose, capillary   Collection Time: 01/20/19  7:22 AM  Result Value Ref Range   Glucose-Capillary 81 70 - 99 mg/dL  Results for orders placed or performed during the hospital encounter of 01/15/19 (from the past 336 hour(s))  CBC WITH DIFFERENTIAL   Collection Time: 01/15/19  8:05 AM  Result Value Ref Range   WBC 6.3 4.0 - 10.5 K/uL   RBC 4.56  3.87 - 5.11 MIL/uL   Hemoglobin 10.9 (L) 12.0 - 15.0 g/dL   HCT 36.6 36.0 - 46.0 %   MCV 80.3 80.0 - 100.0 fL   MCH 23.9 (L) 26.0 - 34.0 pg   MCHC 29.8 (L) 30.0 - 36.0 g/dL   RDW 14.3 11.5 - 15.5 %   Platelets 326 150 - 400 K/uL   nRBC 0.0 0.0 - 0.2 %   Neutrophils Relative % 57 %   Neutro Abs 3.6 1.7 - 7.7 K/uL   Lymphocytes Relative 30 %   Lymphs Abs 1.9 0.7 - 4.0 K/uL   Monocytes Relative 7 %   Monocytes Absolute 0.5 0.1 - 1.0 K/uL   Eosinophils Relative 4 %   Eosinophils Absolute 0.3 0.0 - 0.5 K/uL   Basophils Relative 1 %   Basophils Absolute 0.0 0.0 - 0.1 K/uL   Immature Granulocytes  1 %   Abs Immature Granulocytes 0.03 0.00 - 0.07 K/uL  Comprehensive metabolic panel   Collection Time: 01/15/19  8:05 AM  Result Value Ref Range   Sodium 140 135 - 145 mmol/L   Potassium 3.4 (L) 3.5 - 5.1 mmol/L   Chloride 103 98 - 111 mmol/L   CO2 28 22 - 32 mmol/L   Glucose, Bld 102 (H) 70 - 99 mg/dL   BUN 15 6 - 20 mg/dL   Creatinine, Ser 0.73 0.44 - 1.00 mg/dL   Calcium 8.3 (L) 8.9 - 10.3 mg/dL   Total Protein 6.9 6.5 - 8.1 g/dL   Albumin 3.5 3.5 - 5.0 g/dL   AST 18 15 - 41 U/L   ALT 16 0 - 44 U/L   Alkaline Phosphatase 93 38 - 126 U/L   Total Bilirubin 0.4 0.3 - 1.2 mg/dL   GFR calc non Af Amer >60 >60 mL/min   GFR calc Af Amer >60 >60 mL/min   Anion gap 9 5 - 15  Type and screen   Collection Time: 01/15/19  8:05 AM  Result Value Ref Range   ABO/RH(D) O NEG    Antibody Screen NEG    Sample Expiration 01/29/2019,2359    Extend sample reason      NO TRANSFUSIONS OR PREGNANCY IN THE PAST 3 MONTHS Performed at [facility], [address] [place], [place] [address]   Novel Coronavirus, NAA (hospital order; send-out to ref lab)   Collection Time: 01/15/19  8:33 AM   Specimen: Nasopharyngeal Swab; Respiratory  Result Value Ref Range   SARS-CoV-2, NAA NOT DETECTED NOT DETECTED   Coronavirus Source NASOPHARYNGEAL   Results for orders placed or performed in visit on 01/14/19 (from the past 336 hour(s))  T3   Collection Time: 01/14/19  2:15 PM  Result Value Ref Range   T3, Total 156 71 - 180 ng/dL  T4, free   Collection Time: 01/14/19  2:15 PM  Result Value Ref Range   Free T4 1.69 (H) 0.61 - 1.12 ng/dL  TSH   Collection Time: 01/14/19  2:15 PM  Result Value Ref Range   TSH 1.431 0.350 - 4.500 uIU/mL    Imaging Studies: No results found.  Assessment: Patient Active Problem List   Diagnosis Date Noted  . Carpal tunnel syndrome 01/13/2019  . Endometrial cancer ([place]) 01/13/2019  . Hyperthyroidism 06/16/2018  . Bilateral foot pain 05/07/2016     Plan: Patient will undergo surgical management with Robotic assisted TLH BSO and sentinel pelvic lymph node mapping and biopsy.   The risks of surgery were discussed in  detail with the patient including but not limited to: bleeding which may require transfusion or reoperation; infection which may require antibiotics; injury to surrounding organs which may involve bowel, bladder, ureters ; need for additional procedures including laparoscopy or laparotomy; thromboembolic phenomenon, surgical site problems and other postoperative/anesthesia complications. Likelihood of success in alleviating the patient's condition was discussed. Routine postoperative instructions will be reviewed with the patient and her family in detail after surgery.  The patient concurred with the proposed plan, giving informed written consent for the surgery.  Patient has been NPO since last night she will remain NPO for procedure.  Anesthesia and OR aware.  Preoperative prophylactic antibiotics and SCDs ordered on call to the OR.  To OR when ready.  ----- Larey Days, MD, [place] Attending Obstetrician and Gynecologist [facility], Department of [facility]  01/20/2019 7:34 AM

## 2019-01-20 NOTE — Transfer of Care (Signed)
Immediate Anesthesia Transfer of Care Note  Patient: Cassandra Sparks  Procedure(s) Performed: ROBOTIC ASSISTED TOTAL LAPAROSCOPIC HYSTERECTOMY WITH BILATERAL SALPINGO OOPHORECTOMY, PELVIC SENTINEL LYMPH NODE MAPPING, POSSIBLE PELVIC/AORTIC LYMPH NODE DISSECTION (N/A )  Patient Location: PACU  Anesthesia Type:General  Level of Consciousness: drowsy  Airway & Oxygen Therapy: Patient Spontanous Breathing and Patient connected to face mask oxygen  Post-op Assessment: Report given to RN and Post -op Vital signs reviewed and stable  Post vital signs: stable  Last Vitals:  Vitals Value Taken Time  BP 125/66 01/20/19 1328  Temp 36.1 C 01/20/19 1328  Pulse 78 01/20/19 1334  Resp 18 01/20/19 1334  SpO2 100 % 01/20/19 1334  Vitals shown include unvalidated device data.  Last Pain:  Vitals:   01/20/19 1328  TempSrc:   PainSc: Asleep         Complications: No apparent anesthesia complications

## 2019-01-20 NOTE — Op Note (Signed)
Operative Note   Date 01/20/2019 Time: 12:31 PM  PRE-OP DIAGNOSIS: ENDOMETRIAL CANCER, GRADE 1. BMI 54.19 kg/m.    POST-OP DIAGNOSIS: STAGE 1 ENDOMETRIAL CANCER, GRADE 1, pending final pathology. BMI 54.19 kg/m. Abdominal pelvic adhesive disease.   SURGEON: Surgeon(s) and Role: Panel 1:    Larey Days, MD  Panel 2:    * Liala Codispoti Gaetana Michaelis, MD - Secondary for Sentinel node injection, mapping, and biopsy.   ANESTHESIA: Choice   PROCEDURE: Procedure(s): Exam under anesthesia, robotic assisted laparoscopic hysterectomy, bilateral salpingo-oophorectomy; lysis of adhesions (30 minutes); sentinel node injection, mapping, and bilateral pelvic sentinel lymph node biopsy; and washings.   ESTIMATED BLOOD LOSS: Minimal  DRAINS: Foley  TOTAL IV FLUIDS: as per anesthesia  TOTAL UOP: as per anesthesia  SPECIMENS:  Right primary sentinel external iliac artery node; left primary sentinel external iliac artery/vein node; uterus/cervix, tubes/ovaries, and washings.    COMPLICATIONS: None  DISPOSITION: PACU  CONDITION: Stable  INDICATIONS: Endometrial cancer  FINDINGS: Exam under anesthesia revealed a normal cervix and vagina. The uterus could not be palpated due to habitus. There were no adnexal masses or nodularity, but exam limited. The parametria was smooth. On speculum exam the cervix was negative for gross lesions. The uterus sounded to 9 cm. Intraoperative findings included normal uterus 8-9 cm. The adnexa were normal bilaterally, but adhesions noted involving the left ovary and tube.There was extensive abdominal adhesive disease from above the umbilicus to the pelvis with involvement of the omentum to the anterior abdominal wall. The upper abdomen was normal including omentum, bowel, liver, stomach, and diaphragmatic surfaces. There was no evidence of grossly malignant pelvic or right para-aortic lymph nodes. The sentinel node injection was successful with evidence of right  primary sentinel external iliac artery node and left primary sentinel node wedged between the external iliac artery and vein at the bifurcation of the common iliac artery. The patient's obesity extended the length and complexity of the procedure given difficulties with visualization and exposure. The patient tolerated the procedure well.    PROCEDURE IN DETAIL: After informed consent was obtained, the patient was taken to the operating room where anesthesia was obtained without difficulty. The patient was positioned in the dorsal lithotomy position in [place] and her arms were carefully tucked at her sides and the usual precautions were taken.  She was prepped and draped in normal sterile fashion.  Time-out was performed and a Foley catheter was placed into the bladder and the cervix was infiltrated with 4 ml of ICG at 3 an 9 o'clock both superficial and deep injections. A standard VCare uterine manipulator was then placed in the uterus without incident.    Operative entry was obtained via an umbilical incision and entry confirmed visually. The Hasson port was placed, abdomen insuffulated, and pelvis visualized with noted findings above. Due to adhesive disease only the right robotic port and LUQ port placemen could be performed. The ligasure was used to lyse adesions which took at least 30 minutes. When able the left robotic ports were placed.The patient was placed in as far as Trendelenburg as possible and the bowel was displaced up into the upper abdomen. Trendelenburg was limited in order to maximize ventilation. Similarly the pneumoperitoneum was reduced to 12.   Cytologic washings were obtained. Next robotic docking was performed and instruments inserted using direct visualization.   Round ligaments were divided on each side with the EndoShears and the retroperitoneal space was opened bilaterally.  The ureters were identified and preserved.  At this point the retroperitoneal spaces were developed  and the lymphatic channels were mapped to each side.  The primary sentinel node on the right side was then identified, skeletonized and removed taking care not to injure the  obturator nerve, the ureter or the pelvic vasculature.  On the left adhesions involving the peritoneum to the left adeexa and epiploica were lysed. Similarly on the left side, the retroperitoneal spaces were developed, the lymphatic channels mapped to identify the sentinel node and it was similarly removed with care to preserve the obturator nerve, the ureter and the pelvic vasculature. With hemostasis secured, the infundibulopelvic ligaments were skeletonized, sealed and divided using either the fenestrated bipolar or with the robotic sealing device.  A bladder flap was created and the bladder was dissected down off the lower uterine segment and cervix.  The uterine arteries were skeletonized bilaterally, sealed and divided.  A colpotomy was performed circumferentially along the V-Care ring with electrocautery and the cervix was incised from the vagina and the specimen was removed through the vagina.  A pneumo balloon was placed in the vagina and the sentinel nodes were removed. Next the vaginal cuff was then closed in a running continuous fashion using the 0 V-Lock suture with careful attention to include the vaginal cuff angles and the vaginal mucosa within the closure.    Hemostasis was observed. The intraperitoneal pressure was dropped, and all planes of dissection, vascular pedicles and the vaginal cuff were found to be hemostatic.  The LUQ trocar was removed and the fascia was closed with 0 Vicryl suture using the Endoclose technique. The lateral trocars were removed under visualization.   Before the umbilical trocar was removed the CO2 gas was released.  The fascia there was closed with 0 Vicryl suture in interrupted technique.   The skin incision at the umbilicus was closed with subcuticular stitch.  The remaining skin incisions were  closed with Indermil glue.  The patient tolerated the procedure well.  Sponge, lap and needle counts were correct x2.  The patient was taken to recovery room in excellent condition.  Antibiotics: Given 1st or 2nd generation cephalosporin, Antibiotics given within 1 hour of the start of the procedure, Antibiotics ordered to be discontinued within 24 hours post procedure   VTE prophylaxis: was ordered perioperatively. Per VTE algorithm she is 4 points; heparin was given preop and SCDs in the OR were used.  Gyn VTE Prophylaxis Algorithm  Risk factors for VTE (calculator):  Point value Risk factors 1 point each age 10-60 acute medical illness 2 points each BMI >30 3 points each none in this category 5 points each  none in this category  Major surgery (>30 min); known malignancy or concern for malignancy (elevated tumor markers); minimally invasive surgery.  Risk assessment: 0-4 total points; High risk - heparin/UFH and SCDs (preoperative and continued while in the hospital)   I performed the sentinel node injection, mapping, and bilateral pelvic sentinel lymph node biopsy with Dr. Leonides Schanz assisting. Dr. Leonides Schanz performed the robotic assisted laparoscopic hysterectomy, bilateral salpingo-oophorectomy; lysis of adhesions and I assisted her with this portion of the procedure.  Lazette Estala Gaetana Michaelis, MD

## 2019-01-20 NOTE — Anesthesia Preprocedure Evaluation (Signed)
Anesthesia Evaluation  Patient identified by MRN, date of birth, ID band Patient awake    Reviewed: Allergy & Precautions, NPO status , Patient's Chart, lab work & pertinent test results  History of Anesthesia Complications Negative for: history of anesthetic complications  Airway Mallampati: III  TM Distance: >3 FB Neck ROM: Full    Dental no notable dental hx.    Pulmonary neg pulmonary ROS, neg sleep apnea, neg COPD,    breath sounds clear to auscultation- rhonchi (-) wheezing      Cardiovascular hypertension, Pt. on medications (-) CAD, (-) Past MI, (-) Cardiac Stents and (-) CABG  Rhythm:Regular Rate:Normal - Systolic murmurs and - Diastolic murmurs    Neuro/Psych neg Seizures negative neurological ROS  negative psych ROS   GI/Hepatic negative GI ROS, Neg liver ROS,   Endo/Other  diabetes, Oral Hypoglycemic Agents  Renal/GU negative Renal ROS     Musculoskeletal negative musculoskeletal ROS (+)   Abdominal (+) + obese,   Peds  Hematology  (+) anemia ,   Anesthesia Other Findings Past Medical History: No date: Anemia No date: Diabetes mellitus without complication ([facility]) No date: Hypertension No date: Hyperthyroidism No date: Primary endometrioid carcinoma of endometrium of uterine  body ([facility]) No date: Thyroid disease   Reproductive/Obstetrics                             Anesthesia Physical Anesthesia Plan  ASA: II  Anesthesia Plan: General   Post-op Pain Management:    Induction: Intravenous  PONV Risk Score and Plan: 2 and Ondansetron, Dexamethasone and Midazolam  Airway Management Planned: Oral ETT  Additional Equipment:   Intra-op Plan:   Post-operative Plan: Extubation in OR  Informed Consent: I have reviewed the patients History and Physical, chart, labs and discussed the procedure including the risks, benefits and alternatives for the proposed anesthesia with  the patient or authorized representative who has indicated his/her understanding and acceptance.     Dental advisory given  Plan Discussed with: CRNA and Anesthesiologist  Anesthesia Plan Comments:         Anesthesia Quick Evaluation

## 2019-01-20 NOTE — Anesthesia Post-op Follow-up Note (Signed)
Anesthesia QCDR form completed.        

## 2019-01-20 NOTE — OR Nursing (Signed)
I spoke with Lamont Snowball, mother and encouraged her to go wait at home due to the length of the procedure. She agreed to this.

## 2019-01-20 NOTE — Discharge Instructions (Signed)
Discharge instructions:  Call office if you have any of the following: fever >101 F, chills, shortness of breath, excessive vaginal bleeding, incision drainage or problems, leg pain or redness, or any other concerns.   Activity: Do not lift > 10 lbs for 8 weeks.  No intercourse or tampons for 8 weeks.  No driving for 1-2 weeks.   You may feel some pain in your upper right abdomen/rib and right shoulder.  This is from the gas in the abdomen for surgery. This will subside over time, please be patient!  Take 800mg  Ibuprofen and 1000mg  Tylenol around the clock, every 6 hours for at least the first 3-5 days.  After this you can take as needed.  This will help decrease inflammation and promote healing.  The narcotics you'll take just as needed, as they just trick your brain into thinking its not in pain.  Gabapentin at night for sleep.  Take Lovenox shots daily until you are back to your normal routine.  Please don't limit yourself in terms of routine activity.  You will be able to do most things, although they may take longer to do or be a little painful.  You can do it!  Don't be a hero, but don't be a wimp either!   AMBULATORY SURGERY  DISCHARGE INSTRUCTIONS   1) The drugs that you were given will stay in your system until tomorrow so for the next 24 hours you should not:  A) Drive an automobile B) Make any legal decisions C) Drink any alcoholic beverage   2) You may resume regular meals tomorrow.  Today it is better to start with liquids and gradually work up to solid foods.  You may eat anything you prefer, but it is better to start with liquids, then soup and crackers, and gradually work up to solid foods.   3) Please notify your doctor immediately if you have any unusual bleeding, trouble breathing, redness and pain at the surgery site, drainage, fever, or pain not relieved by medication.    4) Additional Instructions:        Please contact your physician with any problems  or Same Day Surgery at [phone], Monday through Friday 6 am to 4 pm, or [place] at [facility] number at [phone].

## 2019-01-20 NOTE — OR Nursing (Signed)
Noted bilateral scleral edema, moderate bulge lower lateral portion of sclera.  Patient states "eyes feel tight".  No crepitus noted on upper torso. No change in vision per patient. Dr. Andree Elk notified.  States okay to progress to phase 2 and instruct patient and caregiver to observe for improvement.

## 2019-01-21 ENCOUNTER — Encounter: Payer: Self-pay | Admitting: Obstetrics & Gynecology

## 2019-01-21 DIAGNOSIS — C541 Malignant neoplasm of endometrium: Secondary | ICD-10-CM | POA: Diagnosis not present

## 2019-01-21 LAB — CYTOLOGY - NON PAP

## 2019-01-21 MED ORDER — HYDROCHLOROTHIAZIDE 25 MG PO TABS: 25.0000 mg | ORAL_TABLET | Freq: Every day | ORAL | Status: DC

## 2019-01-21 MED ORDER — METHIMAZOLE 5 MG PO TABS
5.0000 mg | ORAL_TABLET | Freq: Every day | ORAL | Status: DC
  Filled 2019-01-21: qty 1

## 2019-01-21 MED ORDER — ENOXAPARIN SODIUM 40 MG/0.4ML ~~LOC~~ SOLN: 40.0000 mg | SUBCUTANEOUS | Status: DC

## 2019-01-21 MED ORDER — METFORMIN HCL 500 MG PO TABS
1000.0000 mg | ORAL_TABLET | Freq: Every day | ORAL | Status: DC
  Filled 2019-01-21: qty 2

## 2019-01-21 MED ORDER — LISINOPRIL 10 MG PO TABS
10.0000 mg | ORAL_TABLET | Freq: Every day | ORAL | Status: DC
  Filled 2019-01-21: qty 1

## 2019-01-21 NOTE — Anesthesia Postprocedure Evaluation (Signed)
Anesthesia Post Note  Patient: Cassandra Sparks  Procedure(s) Performed: ROBOTIC ASSISTED TOTAL LAPAROSCOPIC HYSTERECTOMY WITH BILATERAL SALPINGO OOPHORECTOMY, PELVIC SENTINEL LYMPH NODE MAPPING, POSSIBLE PELVIC/AORTIC LYMPH NODE DISSECTION (N/A )  Patient location during evaluation: PACU Anesthesia Type: General Level of consciousness: awake and alert Pain management: pain level controlled Vital Signs Assessment: post-procedure vital signs reviewed and stable Respiratory status: spontaneous breathing, nonlabored ventilation, respiratory function stable and patient connected to nasal cannula oxygen Cardiovascular status: blood pressure returned to baseline and stable Postop Assessment: no apparent nausea or vomiting Anesthetic complications: no     Last Vitals:  Vitals:   01/20/19 2300 01/21/19 0313  BP: 117/63 136/87  Pulse: 84 72  Resp: 16 16  Temp: 37.2 C 36.9 C  SpO2: 95% 97%    Last Pain:  Vitals:   01/21/19 0313  TempSrc: Oral  PainSc:                  [name] [name]

## 2019-01-21 NOTE — Progress Notes (Signed)
Patient discharged home. Discharge instructions, prescriptions and follow up appointment given to and reviewed with patient. Patient verbalized understanding.. waiting on ride

## 2019-01-21 NOTE — Progress Notes (Signed)
Patient wheeled out by NT

## 2019-01-24 NOTE — Discharge Summary (Signed)
Gynecology Physician Postoperative Discharge Summary  Patient ID: Cassandra Sparks MRN: [id number] DOB/AGE: July 23, 1970 48 y.o.  Admit Date: 01/20/2019 Discharge Date: 01/24/2019  Preoperative Diagnoses: uterine cancer, morbid obesity Postoperative Diagnoses: uterine cancer, morbid obesity  Procedures: Procedure(s) (LRB): ROBOTIC ASSISTED TOTAL LAPAROSCOPIC HYSTERECTOMY WITH BILATERAL SALPINGO OOPHORECTOMY, PELVIC SENTINEL LYMPH NODE MAPPING, POSSIBLE PELVIC/AORTIC LYMPH NODE DISSECTION (N/A)  LYSIS OF ADHESIONS  CBC Latest Ref Rng & Units 01/15/2019 04/05/2012  WBC 4.0 - 10.5 K/uL 6.3 6.1  Hemoglobin 12.0 - 15.0 g/dL 10.9(L) 11.3(L)  Hematocrit 36.0 - 46.0 % 36.6 34.6(L)  Platelets 150 - 400 K/uL 326 332    Hospital Course:  Cassandra Sparks is a 48 y.o.  admitted for scheduled surgery.  She underwent the procedures as mentioned above, her operation was uncomplicated. For further details about surgery, please refer to the operative report. Patient was due to be discharged from PACU but did not wake from anesthesia at expected rate, and had significant facial swelling from trendelenburg.  Patient was kept overnight, and had an uncomplicated postoperative course. By time of discharge on POD#1, her pain was controlled on oral pain medications; she was ambulating, voiding without difficulty, tolerating regular diet and passing flatus. She was deemed stable for discharge to home.   Discharge Exam: Blood pressure 110/76, pulse 83, temperature 99.1 F (37.3 C), temperature source Oral, resp. rate 16, height 5\' 7"  (1.702 m), weight (!) 156 kg, SpO2 98 %. General appearance: alert and no distress  Resp: clear to auscultation bilaterally, normal respiratory effort Cardio: regular rate and rhythm  GI: soft, non-tender; bowel sounds normal; no masses, no organomegaly.  Incision: C/D/I, no erythema, no drainage noted Pelvic: scant blood on pad  Extremities: extremities normal, atraumatic, no cyanosis or  edema and Homans sign is negative, no sign of DVT  Discharged Condition: Stable    Allergies as of 01/21/2019      Reactions   Latex Hives, Itching   Fish Allergy Rash      Medication List    STOP taking these medications   acetaminophen 325 MG tablet Commonly known as: TYLENOL   ibuprofen 200 MG tablet Commonly known as: ADVIL     TAKE these medications   Biotin 10000 MCG Tabs Take 10,000 mcg by mouth daily.   docusate sodium 100 MG capsule Commonly known as: COLACE Take 100 mg by mouth daily.   FeroSul 325 (65 FE) MG tablet Generic drug: ferrous sulfate Take 325 mg by mouth daily with breakfast.   folic acid 500 MCG tablet Commonly known as: FOLVITE Take 400 mcg by mouth daily.   hydrochlorothiazide 25 MG tablet Commonly known as: HYDRODIURIL Take 25 mg by mouth daily.   lisinopril 10 MG tablet Commonly known as: ZESTRIL Take 10 mg by mouth daily.   metFORMIN 500 MG tablet Commonly known as: GLUCOPHAGE Take 1,000 mg by mouth daily with breakfast.   methimazole 5 MG tablet Commonly known as: TAPAZOLE Take 5 mg by mouth daily.      Follow-up Information    Cassandra Sparks, Cassandra Loh, MD. Go on 02/04/2019.   Specialty: Obstetrics and Gynecology Why: 10:15 am for post op visit Contact information: [place] [id number] [phone]           Signed:  [place] Attending [facility]

## 2019-01-24 NOTE — Op Note (Addendum)
01/20/2019  PATIENT:  Cassandra Sparks  48 y.o. female  PRE-OPERATIVE DIAGNOSIS:  UTERINE CANCER  POST-OPERATIVE DIAGNOSIS:  UTERINE CANCER, INTRAABDOMINAL SCAR TISSUE  PROCEDURE:  Procedure(s): ROBOTIC ASSISTED TOTAL LAPAROSCOPIC HYSTERECTOMY WITH BILATERAL SALPINGO OOPHORECTOMY, PELVIC SENTINEL LYMPH NODE MAPPING, POSSIBLE PELVIC/AORTIC LYMPH NODE DISSECTION (N/A) LYSIS OF ADHESIONS  SURGEON:  Surgeon(s) and Role:    * , Honor Loh, MD - Primary    * [place], [place], MD  ANESTHESIA: GET  EBL:  30cc  DRAINS: foley to gravity / removed in OR  SPECIMEN: Uterus, Cervix, bilateral tubes and ovaries, pelvic washings, right and left external iliac sentinel pelvic lymph nodes.  DISPOSITION OF SPECIMEN:  To pathology  COUNTS: correct x2  COMPLICATIONS: none apparent  PATIENT DISPOSITION:  VS stable to PACU    Indication for surgery: Patient had presented with complaints of postmenopausal bleeding and was found on biopsy to have well differentiated endometrioid adenocarcinoma.  Various treatment options were discussed and patient requested a hysterectomy to resolve her symptoms. Due to her medical co-morbidities, a robotic approach was preferred.  Risks benefits and alternatives were reviewed and informed consent was obtained.   Findings:   Exam under anesthesia showed a cervix high in the vagina, and due to patient's morbid obesity further evaluation was limited by body habitus.  There were no adnexal masses or nodularity. The parametria was smooth. The cervix was negative for gross lesions.  Intraabdominally, the omentum was densely and widely adherent to the anterior abdominal wall from the umbilicus to the pelvis. The sigmoid was adherent to the left adnexa.  She small bowel had some adhesions to itself and to the sigmoid.  The adnexa were normal bilaterally. The upper abdomen was normal including bowel, liver, stomach, and diaphragmatic surfaces. There was no evidence of  grossly malignant pelvic or right para-aortic lymph nodes. The sentinel node injection was successful with evidence of bilateral primary sentinel external iliac nodes.   Procedure: The patient was brought to the OR and identified as Cassandra Sparks.  She was given general anesthesia via endotracheal route.  Nasogastric tube was placed.  She was then positioned in the dorsal lithotomy position with arms tucked, and she was prepped and draped in the usual sterile fashion.  A surgical time-out was called. A foley catheter was placed.  A speculum was placed in the vagina and the cervix was visualized, grasped with a single tooth tenaculum and the cervix was infiltrated with 4 ml of ICG at 3 an 9 o'clock both superficial and deep injections.  The V-Care uterine manipulator was placed in and around the cervix.  After a change of gloves, the attention was turned to the abdomen.  A supraumbilical incision was made, and the subcutaneous tissues were dissected, the fascia was divided, the peritoneum entered, and a balloon trochar was inserted.  Pneumoperitoneum was created to 11mmHg.  Three Robotic trochars and one 12mm assist trochar were inserted atraumatically under visualization. The omentum was adherent to the anterior abdominal wall, and obscuring the view of the pelvis; it was ligated atraumatically via the [place]. The patient was placed in steep trendelenburg, and the daVinci robot was docked and monopolar scissors, bipolar fenestrated grasper, and fenestrated grasper were employed.  A brief survey of the upper abdomen was performed.  The attention was turned to the pelvis.  The bilateral round ligaments were cauterized and transected and the retroperitoneal spaces were opened bilaterally.  The ureters were identified and preserved.  At this point the retroperitoneal  spaces were developed and the lymphatic channels were mapped to each side.  The primary sentinel node on both sides were then identified along  the external iliac artery and vein.  Each was skeletonized and removed taking care not to injure the obturator nerve, the ureter or the pelvic vasculature.  The anterior peritoneum was divided and a bladder flap was created.  The posterior peritoneum was divided to expose the bilateral IP ligaments.  Bilateral ureters were identified.  The ovarian vessels were doubly burned and transected.  The underlying peritoneum was divided and the bilateral uterine arteries were skeletonized, sealed, and divided.  The cardinal and uterosacral ligaments were divided and a colpotomy was created around the cervical cup.  The uterus, cervix, tubes and ovaries and then each lymph node were removed through the vagina, and to nursing to be sent to pathology.  The instruments were changed to needle drivers, and the vaginal cuff was closed using V-lock in a running stitch.  The cuff was tested for integrity.     Hemostasis was observed. The intraperitoneal pressure was dropped, and all planes of dissection, vascular pedicles and the vaginal cuff were found to be hemostatic. The laparoscopic instruments were removed, and the fascia of the 62mm and Hassan trochar sites were closed using the inlet closure device.  The remaining trochars were removed after release of the pneumoperitoneum. The skin of all incisions were closed with 4-0 monocryl and covered with surgical glue.  The procedure was then deemed complete.    The sponge, needle, and instrument counts were correct x2.  The patient tolerated reversal of anesthesia, and was brought to the PACU in a stable condition.  I was present and performed this case in its entirety. Larey Days, MD Attending Obstetrician and Gynecologist [facility]

## 2019-02-01 LAB — SURGICAL PATHOLOGY

## 2019-02-04 DIAGNOSIS — R300 Dysuria: Secondary | ICD-10-CM | POA: Diagnosis not present

## 2019-02-24 ENCOUNTER — Ambulatory Visit: Payer: 59

## 2019-03-03 ENCOUNTER — Ambulatory Visit: Payer: 59

## 2019-03-10 ENCOUNTER — Other Ambulatory Visit: Payer: Self-pay

## 2019-03-10 ENCOUNTER — Inpatient Hospital Stay: Payer: 59 | Attending: Obstetrics and Gynecology | Admitting: Obstetrics and Gynecology

## 2019-03-10 VITALS — BP 135/95 | HR 82 | Temp 98.3°F | Resp 20 | Ht 67.0 in | Wt 346.2 lb

## 2019-03-10 DIAGNOSIS — C541 Malignant neoplasm of endometrium: Secondary | ICD-10-CM | POA: Insufficient documentation

## 2019-03-10 DIAGNOSIS — Z90710 Acquired absence of both cervix and uterus: Secondary | ICD-10-CM | POA: Insufficient documentation

## 2019-03-10 DIAGNOSIS — Z90722 Acquired absence of ovaries, bilateral: Secondary | ICD-10-CM | POA: Insufficient documentation

## 2019-03-10 NOTE — Progress Notes (Signed)
Gynecologic Oncology Interval Visit   Referring Provider: Dr. Ouida Sills  Chief Complaint: Endometrial Cancer, grade 1  Subjective:  Cassandra Sparks is a 48 y.o. G80P2 female who is seen in consultation from Dr. Ouida Sills for endometrial cancer.   On 01/20/2019 she underwent exam under anesthesia, robotic assisted laparoscopic hysterectomy, bilateral salpingo-oophorectomy; lysis of adhesions (30 minutes); sentinel node injection, mapping, and bilateral pelvic sentinel lymph node biopsy; and washings with Dr. Leonides Schanz.   Final pathology:  Histologic Type: Endometrioid carcinoma, with mucinous differentiation  Histologic Grade: FIGO grade 1   Myometrial Invasion: present, MELF type    Depth of Myometrial Invasion (millimeters): 1 mm    Myometrial Thickness (millimeters): 13 mm    Percentage of Myometrial Invasion: 8%   Uterine Serosa Involvement: Not identified  Cervical Stromal Involvement: Not identified  Other Tissue/Organ Involvement: Not identified   MLH1: Intact nuclear expression  MSH2: Intact nuclear expression  MSH6: Intact nuclear expression  PMS2: Intact nuclear expression   Washings: negative  Gynecologic Oncology History  Cassandra Sparks is a pleasant G3P2 female with stage Ia, grade 1 who is seen in consultation from Dr. Ouida Sills for endometrial cancer. Her history is noted below.   Patient initially presented as referral from PCP/Dr. Lennox Grumbles for spotting over past several months. She saw Dr. Ouida Sills on 01/04/2019. Pap was performed but results not yet available. Endometrial biopsy was performed which revealed:   Diagnosis:  Endometrium, Biopsy:  - well differentiated endometrioid carcinoma with mucinous differentiation (FIGO I)     Problem List: Patient Active Problem List   Diagnosis Date Noted  . Post-operative state 01/20/2019  . Carpal tunnel syndrome 01/13/2019  . Endometrial cancer ([place]) 01/13/2019  . Hyperthyroidism 06/16/2018  .  Bilateral foot pain 05/07/2016    Past Medical History: Past Medical History:  Diagnosis Date  . Anemia   . Diabetes mellitus without complication ([place])   . Hypertension   . Hyperthyroidism   . Primary endometrioid carcinoma of endometrium of uterine body ([place])   . Thyroid disease     Past Surgical History: Past Surgical History:  Procedure Laterality Date  . BREAST BIOPSY Right 2013   neg  . DILATION AND CURETTAGE, DIAGNOSTIC / THERAPEUTIC    . ECTOPIC PREGNANCY SURGERY    . ROBOTIC ASSISTED TOTAL HYSTERECTOMY WITH BILATERAL SALPINGO OOPHERECTOMY N/A 01/20/2019   Procedure: ROBOTIC ASSISTED TOTAL LAPAROSCOPIC HYSTERECTOMY WITH BILATERAL SALPINGO OOPHORECTOMY, PELVIC SENTINEL LYMPH NODE MAPPING, POSSIBLE PELVIC/AORTIC LYMPH NODE DISSECTION;  Surgeon: Ward, Honor Loh, MD;  Location: [facility] ORS;  Service: Gynecology;  Laterality: N/A;    Past Gynecologic History:  Menarche: age 49 Periods last ~ 3 days Menses regular Post menopausal  Family History: Family History  Problem Relation Age of Onset  . Diabetes Mother   . Breast cancer Mother   . Heart attack Father   . Stroke Father      Social History: Social History   Socioeconomic History  . Marital status: Single    Spouse name: Not on file  . Number of children: Not on file  . Years of education: Not on file  . Highest education level: Not on file  Occupational History  . Not on file  Social Needs  . Financial resource strain: Not on file  . Food insecurity    Worry: Not on file    Inability: Not on file  . Transportation needs    Medical: Not on file    Non-medical: Not on file  Tobacco Use  .  Smoking status: Never Smoker  . Smokeless tobacco: Never Used  Substance and Sexual Activity  . Alcohol use: No    Alcohol/week: 0.0 standard drinks  . Drug use: Never  . Sexual activity: Yes  Lifestyle  . Physical activity    Days per week: Not on file    Minutes per session: Not on file  . Stress: Not on  file  Relationships  . Social Herbalist on phone: Not on file    Gets together: Not on file    Attends religious service: Not on file    Active member of club or organization: Not on file    Attends meetings of clubs or organizations: Not on file    Relationship status: Not on file  . Intimate partner violence    Fear of current or ex partner: Not on file    Emotionally abused: Not on file    Physically abused: Not on file    Forced sexual activity: Not on file  Other Topics Concern  . Not on file  Social History Narrative  . Not on file    Allergies: Allergies  Allergen Reactions  . Latex Hives and Itching  . Fish Allergy Rash    Current Medications: Current Outpatient Medications  Medication Sig Dispense Refill  . Biotin 10000 MCG TABS Take 10,000 mcg by mouth daily.     Marland Kitchen docusate sodium (COLACE) 100 MG capsule Take 100 mg by mouth daily.    . ferrous sulfate (FEROSUL) 325 (65 FE) MG tablet Take 325 mg by mouth daily with breakfast.    . folic acid (FOLVITE) 629 MCG tablet Take 400 mcg by mouth daily.    . hydrochlorothiazide (HYDRODIURIL) 25 MG tablet Take 25 mg by mouth daily.   2  . lisinopril (PRINIVIL,ZESTRIL) 10 MG tablet Take 10 mg by mouth daily.   2  . metFORMIN (GLUCOPHAGE) 500 MG tablet Take 1,000 mg by mouth daily with breakfast.   2  . methimazole (TAPAZOLE) 5 MG tablet Take 5 mg by mouth daily.     No current facility-administered medications for this visit.     Review of Systems General: no complaints  HEENT: no complaints  Lungs: no complaints  Cardiac: no complaints  GI: no complaints  GU: no complaints  Musculoskeletal: no complaints  Extremities: no complaints  Skin: no complaints  Neuro: no complaints  Endocrine: no complaints  Psych: no complaints       Objective:  Physical Examination:  BP (!) 135/95 (BP Location: Left Arm, Patient Position: Sitting)   Pulse 82   Temp 98.3 F (36.8 C) (Tympanic)   Resp 20   Ht 5' 7"   (1.702 m)   Wt (!) 346 lb 3.2 oz (157 kg)   BMI 54.22 kg/m     ECOG Performance status: 0  General appearance: alert, cooperative and appears stated age HEENT: Atraumatic normocephalic Abdomen: soft nondistended and nontender. Incision all well healed. No hernias, no masses, no appreciable hepatospenomegaly, no ascites. Limited exam due to habitus. Extremities:No edema Skin exam - Well healed incisions.  Neurological exam reveals alert, oriented, normal speech, no focal findings or movement disorder noted  Pelvic: chaperoned by RN; Vulva: normal appearing vulva with no masses, tenderness or lesions; Vagina: normal unable to see top of the cuff, but on palpation well healed and no palpable sutures; BME: negative for masses or nodularity; Uterus and Cervix: surgically absent; Rectal: deferred       Assessment:  Cassandra Sparks  AMANDINE COVINO is a 48 y.o. female diagnosed with stage Ia grade 1 endometrioid endometrial cancer (pMMR). Excellent recovery from surgery.   HTN, asymptomatic  Medical co-morbidities complicating care: hyperthyroidism on methimazole, Body mass index is 54.22 kg/m., HTN, early AODM on metformin.  Plan:   Problem List Items Addressed This Visit      Genitourinary   Endometrial cancer ([place]) - Primary (Chronic)     We reviewed her pathology results which are very reassuring. Her risk of recurrence is < 5%. No further therapy is needed.   I have recommended continued close follow up with exams, including pelvic exams every 3-6 months for 2-3 years, then every 6-12 months for 3-5 years and then annually thereafter.  Imaging and laboratory assessment is based on clinical indication. Patient education for obesity, lifestyle, exercise, nutrition, sexual health, vaginal lubricants. We discussed her weight and need for weight loss, stressed good nutrition, and exercise.  I provided information regarding calorie counting and exercise for weight loss. I offered referral to the CARE  program and provided a pamphlet today. Unfortunately, due to COVID-19 pandemic it is uncertain if the program is ongoing. I recommended abstaining from intercourse until after October 1st to allow for vaginal cuff healing.   We discussed hypertension. She is asymptomatic and will follow up with her PCP.   Plan to alternate visits with Dr. Leonides Schanz or Schermerhorn after her next visit in 6 months.   A total of 15 minutes were spent with the patient/family today; >50% was spent in education, counseling and coordination of care for endometrial cancer.    Rebekha Diveley Gaetana Michaelis, MD    CC:  Dr. Ouida Sills Dr. Leonides Schanz

## 2019-07-22 ENCOUNTER — Other Ambulatory Visit: Payer: 59

## 2019-07-23 ENCOUNTER — Ambulatory Visit
Admission: EM | Admit: 2019-07-23 | Discharge: 2019-07-23 | Disposition: A | Discharge status: Home / Self Care | Payer: 59 | Attending: Family Medicine | Admitting: Family Medicine

## 2019-07-23 ENCOUNTER — Other Ambulatory Visit: Payer: Self-pay

## 2019-07-23 ENCOUNTER — Encounter: Payer: Self-pay | Admitting: Emergency Medicine

## 2019-07-23 DIAGNOSIS — B349 Viral infection, unspecified: Secondary | ICD-10-CM

## 2019-07-23 DIAGNOSIS — R509 Fever, unspecified: Secondary | ICD-10-CM

## 2019-07-23 DIAGNOSIS — M791 Myalgia, unspecified site: Secondary | ICD-10-CM | POA: Diagnosis not present

## 2019-07-23 NOTE — Discharge Instructions (Signed)
Rest, fluids, over the counter cold/cough/analgesic medications as needed Await covid test result

## 2019-07-23 NOTE — ED Provider Notes (Signed)
[facility]    CSN: [id number] Arrival date & time: 07/23/19  1438      History   Chief Complaint Chief Complaint  Patient presents with  . Fever  . Generalized Body Aches    HPI Cassandra Sparks is a 49 y.o. female.   49 yo female with a c/o 2 days of fever and body aches. States she was tested for covid yesterday and test result pending. Denies any sore throat, cough, shortness of breath, vomiting, diarrhea, abdominal pain, dysuria.   Fever   Past Medical History:  Diagnosis Date  . Anemia   . Diabetes mellitus without complication ([place])   . Hypertension   . Hyperthyroidism   . Primary endometrioid carcinoma of endometrium of uterine body ([place])   . Thyroid disease     Patient Active Problem List   Diagnosis Date Noted  . Post-operative state 01/20/2019  . Carpal tunnel syndrome 01/13/2019  . Endometrial cancer ([place]) 01/13/2019  . Hyperthyroidism 06/16/2018  . Bilateral foot pain 05/07/2016    Past Surgical History:  Procedure Laterality Date  . ABDOMINAL HYSTERECTOMY    . BREAST BIOPSY Right 2013   neg  . DILATION AND CURETTAGE, DIAGNOSTIC / THERAPEUTIC    . ECTOPIC PREGNANCY SURGERY    . ROBOTIC ASSISTED TOTAL HYSTERECTOMY WITH BILATERAL SALPINGO OOPHERECTOMY N/A 01/20/2019   Procedure: ROBOTIC ASSISTED TOTAL LAPAROSCOPIC HYSTERECTOMY WITH BILATERAL SALPINGO OOPHORECTOMY, PELVIC SENTINEL LYMPH NODE MAPPING, POSSIBLE PELVIC/AORTIC LYMPH NODE DISSECTION;  Surgeon: Ward, Honor Loh, MD;  Location: [facility] ORS;  Service: Gynecology;  Laterality: N/A;    OB History   No obstetric history on file.      Home Medications    Prior to Admission medications   Medication Sig Start Date End Date Taking? Authorizing Provider  Biotin 10000 MCG TABS Take 10,000 mcg by mouth daily.    Yes [provider]  ferrous sulfate (FEROSUL) 325 (65 FE) MG tablet Take 325 mg by mouth daily with breakfast.   Yes [provider]  folic acid (FOLVITE)  A999333 MCG tablet Take 400 mcg by mouth daily.   Yes [provider]  hydrochlorothiazide (HYDRODIURIL) 25 MG tablet Take 25 mg by mouth daily.  08/03/15  Yes [provider]  lisinopril (PRINIVIL,ZESTRIL) 10 MG tablet Take 10 mg by mouth daily.  08/03/15  Yes [provider]  metFORMIN (GLUCOPHAGE) 500 MG tablet Take 1,000 mg by mouth daily with breakfast.  08/03/15  Yes [provider]  docusate sodium (COLACE) 100 MG capsule Take 100 mg by mouth daily.    [provider]  methimazole (TAPAZOLE) 5 MG tablet Take 5 mg by mouth daily.    [provider]    Family History Family History  Problem Relation Age of Onset  . Diabetes Mother   . Breast cancer Mother   . Heart attack Father   . Stroke Father     Social History Social History   Tobacco Use  . Smoking status: Never Smoker  . Smokeless tobacco: Never Used  Substance Use Topics  . Alcohol use: No    Alcohol/week: 0.0 standard drinks  . Drug use: Never     Allergies   Latex and Fish allergy   Review of Systems Review of Systems  Constitutional: Positive for fever.     Physical Exam Triage Vital Signs ED Triage Vitals  Enc Vitals Group     BP 07/23/19 1452 109/87     Pulse Rate 07/23/19 1452 81  Resp 07/23/19 1452 16     Temp 07/23/19 1452 98.4 F (36.9 C)     Temp Source 07/23/19 1452 Oral     SpO2 07/23/19 1452 97 %     Weight 07/23/19 1449 (!) 330 lb (149.7 kg)     Height 07/23/19 1449 5\' 6"  (1.676 m)     Head Circumference --      Peak Flow --      Pain Score 07/23/19 1449 5     Pain Loc --      Pain Edu? --      Excl. in [place]? --    No data found.  Updated Vital Signs BP 109/87 (BP Location: Right Arm)   Pulse 81   Temp 98.4 F (36.9 C) (Oral)   Resp 16   Ht 5\' 6"  (1.676 m)   Wt (!) 149.7 kg   SpO2 97%   BMI 53.26 kg/m   Visual Acuity Right Eye Distance:   Left Eye Distance:   Bilateral Distance:    Right Eye Near:   Left Eye Near:     Bilateral Near:     Physical Exam Vitals and nursing note reviewed.  Constitutional:      General: She is not in acute distress.    Appearance: She is not toxic-appearing or diaphoretic.  Cardiovascular:     Rate and Rhythm: Normal rate.  Pulmonary:     Effort: Pulmonary effort is normal. No respiratory distress.     Breath sounds: Normal breath sounds.  Abdominal:     General: There is no distension.     Palpations: Abdomen is soft.  Neurological:     Mental Status: She is alert.      UC Treatments / Results  Labs (all labs ordered are listed, but only abnormal results are displayed) Labs Reviewed - No data to display  EKG   Radiology No results found.  Procedures Procedures (including critical care time)  Medications Ordered in UC Medications - No data to display  Initial Impression / Assessment and Plan / UC Course  I have reviewed the triage vital signs and the nursing notes.  Pertinent labs & imaging results that were available during my care of the patient were reviewed by me and considered in my medical decision making (see chart for details).     Final Clinical Impressions(s) / UC Diagnoses   Final diagnoses:  Viral illness     Discharge Instructions     Rest, fluids, over the counter cold/cough/analgesic medications as needed Await covid test result    ED Prescriptions    None     1. diagnosis reviewed with patient 2.. Recommend supportive treatment as above 3. Await covid test result 4. Follow-up prn if symptoms worsen or don't improve   PDMP not reviewed this encounter.   Norval Gable, MD 07/23/19 [phone]

## 2019-07-23 NOTE — ED Triage Notes (Signed)
Patient c/o fever, lower back pain, and bodyaches that started 2 days ago. Patient was tested at her job [employer] yesterday for [place] and her results are still pending.

## 2019-07-29 ENCOUNTER — Other Ambulatory Visit: Payer: Self-pay

## 2019-07-29 ENCOUNTER — Emergency Department
Admission: EM | Admit: 2019-07-29 | Discharge: 2019-07-29 | Disposition: A | Discharge status: Home / Self Care | Payer: 59 | Attending: Emergency Medicine | Admitting: Emergency Medicine

## 2019-07-29 ENCOUNTER — Emergency Department: Payer: 59

## 2019-07-29 DIAGNOSIS — E119 Type 2 diabetes mellitus without complications: Secondary | ICD-10-CM | POA: Diagnosis not present

## 2019-07-29 DIAGNOSIS — Z7984 Long term (current) use of oral hypoglycemic drugs: Secondary | ICD-10-CM | POA: Insufficient documentation

## 2019-07-29 DIAGNOSIS — E860 Dehydration: Secondary | ICD-10-CM | POA: Insufficient documentation

## 2019-07-29 DIAGNOSIS — M791 Myalgia, unspecified site: Secondary | ICD-10-CM

## 2019-07-29 DIAGNOSIS — E039 Hypothyroidism, unspecified: Secondary | ICD-10-CM | POA: Insufficient documentation

## 2019-07-29 DIAGNOSIS — Z8542 Personal history of malignant neoplasm of other parts of uterus: Secondary | ICD-10-CM | POA: Insufficient documentation

## 2019-07-29 DIAGNOSIS — R05 Cough: Secondary | ICD-10-CM | POA: Diagnosis not present

## 2019-07-29 DIAGNOSIS — U071 COVID-19: Secondary | ICD-10-CM | POA: Diagnosis not present

## 2019-07-29 DIAGNOSIS — R0609 Other forms of dyspnea: Secondary | ICD-10-CM | POA: Diagnosis not present

## 2019-07-29 DIAGNOSIS — R5383 Other fatigue: Secondary | ICD-10-CM | POA: Diagnosis not present

## 2019-07-29 DIAGNOSIS — R0602 Shortness of breath: Secondary | ICD-10-CM | POA: Diagnosis not present

## 2019-07-29 DIAGNOSIS — Z91040 Latex allergy status: Secondary | ICD-10-CM | POA: Diagnosis not present

## 2019-07-29 DIAGNOSIS — I10 Essential (primary) hypertension: Secondary | ICD-10-CM | POA: Diagnosis not present

## 2019-07-29 DIAGNOSIS — R531 Weakness: Secondary | ICD-10-CM | POA: Diagnosis not present

## 2019-07-29 DIAGNOSIS — M7918 Myalgia, other site: Secondary | ICD-10-CM | POA: Diagnosis not present

## 2019-07-29 DIAGNOSIS — Z79899 Other long term (current) drug therapy: Secondary | ICD-10-CM | POA: Insufficient documentation

## 2019-07-29 DIAGNOSIS — R509 Fever, unspecified: Secondary | ICD-10-CM | POA: Diagnosis present

## 2019-07-29 LAB — CBC WITH DIFFERENTIAL/PLATELET
Abs Immature Granulocytes: 0.01 10*3/uL (ref 0.00–0.07)
Basophils Absolute: 0 10*3/uL (ref 0.0–0.1)
Basophils Relative: 0 %
Eosinophils Absolute: 0 10*3/uL (ref 0.0–0.5)
Eosinophils Relative: 0 %
HCT: 41.2 % (ref 36.0–46.0)
Hemoglobin: 13 g/dL (ref 12.0–15.0)
Immature Granulocytes: 0 %
Lymphocytes Relative: 25 %
Lymphs Abs: 0.7 10*3/uL (ref 0.7–4.0)
MCH: 24.2 pg — ABNORMAL LOW (ref 26.0–34.0)
MCHC: 31.6 g/dL (ref 30.0–36.0)
MCV: 76.7 fL — ABNORMAL LOW (ref 80.0–100.0)
Monocytes Absolute: 0.3 10*3/uL (ref 0.1–1.0)
Monocytes Relative: 11 %
Neutro Abs: 1.8 10*3/uL (ref 1.7–7.7)
Neutrophils Relative %: 64 %
Platelets: 285 10*3/uL (ref 150–400)
RBC: 5.37 MIL/uL — ABNORMAL HIGH (ref 3.87–5.11)
RDW: 14.2 % (ref 11.5–15.5)
WBC: 2.7 10*3/uL — ABNORMAL LOW (ref 4.0–10.5)
nRBC: 0 % (ref 0.0–0.2)

## 2019-07-29 LAB — COMPREHENSIVE METABOLIC PANEL
ALT: 33 U/L (ref 0–44)
AST: 37 U/L (ref 15–41)
Albumin: 3.7 g/dL (ref 3.5–5.0)
Alkaline Phosphatase: 78 U/L (ref 38–126)
Anion gap: 14 (ref 5–15)
BUN: 20 mg/dL (ref 6–20)
CO2: 25 mmol/L (ref 22–32)
Calcium: 8.5 mg/dL — ABNORMAL LOW (ref 8.9–10.3)
Chloride: 96 mmol/L — ABNORMAL LOW (ref 98–111)
Creatinine, Ser: 0.91 mg/dL (ref 0.44–1.00)
GFR calc Af Amer: >60 mL/min (ref >60)
GFR calc non Af Amer: >60 mL/min (ref >60)
Glucose, Bld: 115 mg/dL — ABNORMAL HIGH (ref 70–99)
Potassium: 3.4 mmol/L — ABNORMAL LOW (ref 3.5–5.1)
Sodium: 135 mmol/L (ref 135–145)
Total Bilirubin: 0.6 mg/dL (ref 0.3–1.2)
Total Protein: 8.1 g/dL (ref 6.5–8.1)

## 2019-07-29 MED ORDER — PREDNISONE 20 MG PO TABS: 40.0000 mg | ORAL_TABLET | Freq: Every day | ORAL | 0 refills | Status: AC

## 2019-07-29 MED ORDER — AZITHROMYCIN 250 MG PO TABS: ORAL_TABLET | ORAL | 0 refills | Status: AC

## 2019-07-29 MED ORDER — ONDANSETRON HCL 4 MG/2ML IJ SOLN
4.0000 mg | Freq: Once | INTRAMUSCULAR | Status: AC
  Administered 2019-07-29: 14:00:00 4 mg via INTRAVENOUS
  Filled 2019-07-29: qty 2

## 2019-07-29 MED ORDER — SODIUM CHLORIDE 0.9 % IV BOLUS
1000.0000 mL | Freq: Once | INTRAVENOUS | Status: AC
  Administered 2019-07-29: 1000 mL via INTRAVENOUS

## 2019-07-29 MED ORDER — DEXAMETHASONE SODIUM PHOSPHATE 10 MG/ML IJ SOLN
10.0000 mg | Freq: Once | INTRAMUSCULAR | Status: AC
  Administered 2019-07-29: 14:00:00 10 mg via INTRAVENOUS
  Filled 2019-07-29: qty 1

## 2019-07-29 MED ORDER — KETOROLAC TROMETHAMINE 30 MG/ML IJ SOLN
15.0000 mg | Freq: Once | INTRAMUSCULAR | Status: AC
  Administered 2019-07-29: 15 mg via INTRAVENOUS
  Filled 2019-07-29: qty 1

## 2019-07-29 MED ORDER — HYDROCODONE-HOMATROPINE 5-1.5 MG/5ML PO SYRP: 5.0000 mL | ORAL_SOLUTION | Freq: Four times a day (QID) | ORAL | 0 refills | Status: DC | PRN

## 2019-07-29 MED ORDER — ONDANSETRON 4 MG PO TBDP: 4.0000 mg | ORAL_TABLET | Freq: Three times a day (TID) | ORAL | 0 refills | Status: DC | PRN

## 2019-07-29 NOTE — ED Notes (Signed)
This RN attempted to establish PIV 2x unsuccessful.

## 2019-07-29 NOTE — ED Provider Notes (Signed)
[facility] Emergency Department Provider Note  ____________________________________________   First MD Initiated Contact with Patient 07/29/19 1119     (approximate)  I have reviewed the triage vital signs and the nursing notes.   HISTORY  Chief Complaint URI    HPI Cassandra Sparks is a 49 y.o. female  With h/o HTN, DM, here with fever, cough, chills, fatigue. Pt first started experience sx 8 days ago. She developed cough, fever, fatigue, body aches. Since then, she's had persistent cough, nightly fevers, mild SOB w/ exertion, and nausea w/ diarrhea. No chest pain. She works at [employer] and here at [employer] and has known [place] exposures. No h/o lung disease. No SOB at rest. She has been taking tylenol/advil at home w/o significant relif. She has not been on any prescription meds.       Past Medical History:  Diagnosis Date  . Anemia   . Diabetes mellitus without complication ([place])   . Hypertension   . Hyperthyroidism   . Primary endometrioid carcinoma of endometrium of uterine body ([place])   . Thyroid disease     Patient Active Problem List   Diagnosis Date Noted  . Post-operative state 01/20/2019  . Carpal tunnel syndrome 01/13/2019  . Endometrial cancer ([place]) 01/13/2019  . Hyperthyroidism 06/16/2018  . Bilateral foot pain 05/07/2016    Past Surgical History:  Procedure Laterality Date  . ABDOMINAL HYSTERECTOMY    . BREAST BIOPSY Right 2013   neg  . DILATION AND CURETTAGE, DIAGNOSTIC / THERAPEUTIC    . ECTOPIC PREGNANCY SURGERY    . ROBOTIC ASSISTED TOTAL HYSTERECTOMY WITH BILATERAL SALPINGO OOPHERECTOMY N/A 01/20/2019   Procedure: ROBOTIC ASSISTED TOTAL LAPAROSCOPIC HYSTERECTOMY WITH BILATERAL SALPINGO OOPHORECTOMY, PELVIC SENTINEL LYMPH NODE MAPPING, POSSIBLE PELVIC/AORTIC LYMPH NODE DISSECTION;  Surgeon: Ward, Honor Loh, MD;  Location: [facility] ORS;  Service: Gynecology;  Laterality: N/A;    Prior to Admission medications   Medication Sig Start  Date End Date Taking? Authorizing Provider  Biotin 10000 MCG TABS Take 10,000 mcg by mouth daily.    Yes [provider]  docusate sodium (COLACE) 100 MG capsule Take 100 mg by mouth daily.   Yes [provider]  ferrous sulfate (FEROSUL) 325 (65 FE) MG tablet Take 325 mg by mouth daily with breakfast.   Yes [provider]  hydrochlorothiazide (HYDRODIURIL) 25 MG tablet Take 25 mg by mouth daily.  08/03/15  Yes [provider]  lisinopril (PRINIVIL,ZESTRIL) 10 MG tablet Take 10 mg by mouth daily.  08/03/15  Yes [provider]  metFORMIN (GLUCOPHAGE-XR) 500 MG 24 hr tablet Take 1,000 mg by mouth daily with breakfast. 05/17/19  Yes [provider]  vitamin B-12 (CYANOCOBALAMIN) 1000 MCG tablet Take 1,000 mcg by mouth daily.   Yes [provider]  azithromycin (ZITHROMAX Z-PAK) 250 MG tablet Take 2 tablets (500 mg) on  Day 1,  followed by 1 tablet (250 mg) once daily on Days 2 through 5. 07/29/19 08/03/19  Duffy Bruce, MD  folic acid (FOLVITE) A999333 MCG tablet Take 400 mcg by mouth daily.    [provider]  HYDROcodone-homatropine (HYCODAN) 5-1.5 MG/5ML syrup Take 5 mLs by mouth every 6 (six) hours as needed for cough. 07/29/19   Duffy Bruce, MD  methimazole (TAPAZOLE) 5 MG tablet Take 5 mg by mouth daily.    [provider]  ondansetron (ZOFRAN ODT) 4 MG disintegrating tablet Take 1 tablet (4 mg total) by mouth every 8 (eight) hours as needed for  nausea or vomiting. 07/29/19   Duffy Bruce, MD  predniSONE (DELTASONE) 20 MG tablet Take 2 tablets (40 mg total) by mouth daily for 5 days. 07/29/19 08/03/19  Duffy Bruce, MD    Allergies Latex and Fish allergy  Family History  Problem Relation Age of Onset  . Diabetes Mother   . Breast cancer Mother   . Heart attack Father   . Stroke Father     Social History Social History   Tobacco Use  . Smoking status: Never Smoker  . Smokeless tobacco: Never Used    Substance Use Topics  . Alcohol use: No    Alcohol/week: 0.0 standard drinks  . Drug use: Never    Review of Systems  Review of Systems  Constitutional: Positive for fatigue. Negative for fever.  HENT: Negative for congestion and sore throat.   Eyes: Negative for visual disturbance.  Respiratory: Positive for cough and shortness of breath.   Cardiovascular: Negative for chest pain.  Gastrointestinal: Negative for abdominal pain, diarrhea, nausea and vomiting.  Genitourinary: Negative for flank pain.  Musculoskeletal: Positive for arthralgias and myalgias. Negative for back pain and neck pain.  Skin: Negative for rash and wound.  Neurological: Positive for weakness.  All other systems reviewed and are negative.    ____________________________________________  PHYSICAL EXAM:      VITAL SIGNS: ED Triage Vitals  Enc Vitals Group     BP 07/29/19 0750 (!) 152/85     Pulse Rate 07/29/19 0750 91     Resp 07/29/19 0750 18     Temp 07/29/19 0751 99 F (37.2 C)     Temp Source 07/29/19 0750 Oral     SpO2 07/29/19 0750 94 %     Weight 07/29/19 0751 (!) 330 lb (149.7 kg)     Height 07/29/19 0751 5\' 6"  (1.676 m)     Head Circumference --      Peak Flow --      Pain Score 07/29/19 0750 8     Pain Loc --      Pain Edu? --      Excl. in [place]? --      Physical Exam Vitals and nursing note reviewed.  Constitutional:      General: She is not in acute distress.    Appearance: She is well-developed.  HENT:     Head: Normocephalic and atraumatic.     Mouth/Throat:     Mouth: Mucous membranes are dry.  Eyes:     Conjunctiva/sclera: Conjunctivae normal.  Cardiovascular:     Rate and Rhythm: Regular rhythm. Tachycardia present.     Heart sounds: Normal heart sounds. No murmur. No friction rub.  Pulmonary:     Effort: Pulmonary effort is normal. No respiratory distress.     Breath sounds: Rales (mild, basilar) present. No wheezing.  Abdominal:     General: There is no distension.      Palpations: Abdomen is soft.     Tenderness: There is no abdominal tenderness.  Musculoskeletal:     Cervical back: Neck supple.  Skin:    General: Skin is warm.     Capillary Refill: Capillary refill takes less than 2 seconds.  Neurological:     Mental Status: She is alert and oriented to person, place, and time.     Motor: No abnormal muscle tone.       ____________________________________________   LABS (all labs ordered are listed, but only abnormal results are displayed)  Labs Reviewed  COMPREHENSIVE METABOLIC  PANEL - Abnormal; Notable for the following components:      Result Value   Potassium 3.4 (*)    Chloride 96 (*)    Glucose, Bld 115 (*)    Calcium 8.5 (*)    All other components within normal limits  CBC WITH DIFFERENTIAL/PLATELET - Abnormal; Notable for the following components:   WBC 2.7 (*)    RBC 5.37 (*)    MCV 76.7 (*)    MCH 24.2 (*)    All other components within normal limits  URINALYSIS, COMPLETE (UACMP) WITH MICROSCOPIC    ____________________________________________  EKG: None ________________________________________  RADIOLOGY All imaging, including plain films, CT scans, and ultrasounds, independently reviewed by me, and interpretations confirmed via formal radiology reads.  ED MD interpretation:   CXR: Likely atypical PNA, no PTX, no large consolidation  Official radiology report(s): DG Chest 2 View  Result Date: 07/29/2019 CLINICAL DATA:  Cough shortness of breath, diagnosed with COVID. EXAM: CHEST - 2 VIEW COMPARISON:  04/05/2012 FINDINGS: Cardiomediastinal contours are stable and normal. Signs of subtle interstitial and airspace opacity potentially at the lung bases without signs of dense consolidation or pleural effusion. Lung volumes are diminished compared to the previous exam. Visualized skeletal structures are normal. IMPRESSION: Signs of subtle interstitial and airspace opacity potentially at the lung bases bilaterally and mid  chest on the left without signs of dense consolidation or pleural effusion. Findings could be seen in the setting of viral or atypical pneumonia. Electronically Signed   By: Zetta Bills M.D.   On: 07/29/2019 08:30    ____________________________________________  PROCEDURES   Procedure(s) performed (including Critical Care):  Procedures  ____________________________________________  INITIAL IMPRESSION / MDM / [place] / ED COURSE  As part of my medical decision making, I reviewed the following data within the [place] notes reviewed and incorporated, Old chart reviewed, Notes from prior ED visits, and [place] Controlled Substance Database       *Cassandra Sparks was evaluated in Emergency Department on 07/29/2019 for the symptoms described in the history of present illness. She was evaluated in the context of the global COVID-19 pandemic, which necessitated consideration that the patient might be at risk for infection with the SARS-CoV-2 virus that causes COVID-19. Institutional protocols and algorithms that pertain to the evaluation of patients at risk for COVID-19 are in a state of rapid change based on information released by regulatory bodies including the CDC and federal and state organizations. These policies and algorithms were followed during the patient's care in the ED.  Some ED evaluations and interventions may be delayed as a result of limited staffing during the pandemic.*     Medical Decision Making:  49 yo F here with general malaise 2/2 COVID-19. Pt not hypoxic, speaking in full sentences here. She does appear dehydrated clinically. IVF, steroids, azithro given for findings of PNA, SOB, duration of sx. No signs of acute hypoxia or other complication at this time. Ambulatory in ED without desats. D/c with supportive care.  ____________________________________________  FINAL CLINICAL IMPRESSION(S) / ED DIAGNOSES  Final diagnoses:    COVID-19  Dehydration  Myalgia     MEDICATIONS GIVEN DURING THIS VISIT:  Medications  sodium chloride 0.9 % bolus 1,000 mL (0 mLs Intravenous Stopped 07/29/19 1529)  ketorolac (TORADOL) 30 MG/ML injection 15 mg (15 mg Intravenous Given 07/29/19 1334)  dexamethasone (DECADRON) injection 10 mg (10 mg Intravenous Given 07/29/19 1335)  ondansetron (ZOFRAN) injection 4 mg (4 mg Intravenous  Given 07/29/19 1335)     ED Discharge Orders         Ordered    predniSONE (DELTASONE) 20 MG tablet  Daily     07/29/19 1429    azithromycin (ZITHROMAX Z-PAK) 250 MG tablet     07/29/19 1429    HYDROcodone-homatropine (HYCODAN) 5-1.5 MG/5ML syrup  Every 6 hours PRN     07/29/19 1429    ondansetron (ZOFRAN ODT) 4 MG disintegrating tablet  Every 8 hours PRN     07/29/19 1429           Note:  This document was prepared using Dragon voice recognition software and may include unintentional dictation errors.   Duffy Bruce, MD 07/29/19 [phone]

## 2019-07-29 NOTE — ED Triage Notes (Signed)
Pt states she works at [employer] and was dx with covid Saturday at work and has been having cough, congestion, body aches, back pain, sore throat, N/diarrhea since last Wednesday. States the doctor called her in some medicines but she is not feeling any better. Pt is in NAD at present.

## 2019-07-29 NOTE — Discharge Instructions (Signed)
For your diarrhea: - Take over-the-counter Immodium, this is SAFE for up to 3-5 days  For your symptoms: - Take the prescribed medications

## 2019-07-29 NOTE — ED Notes (Signed)
Pt ambulatory with a steady gait. Pt did not desaturated sats remained at 95%-97%. Pt back on stretcher safely with call bell within arms reach.

## 2019-09-08 ENCOUNTER — Inpatient Hospital Stay: Payer: 59 | Attending: Obstetrics and Gynecology | Admitting: Obstetrics and Gynecology

## 2019-09-08 ENCOUNTER — Other Ambulatory Visit: Payer: Self-pay

## 2019-09-08 ENCOUNTER — Ambulatory Visit: Payer: 59

## 2019-09-08 ENCOUNTER — Encounter: Payer: Self-pay | Admitting: Obstetrics and Gynecology

## 2019-09-08 VITALS — BP 138/90 | HR 87 | Resp 17 | Wt 342.0 lb

## 2019-09-08 DIAGNOSIS — Z7984 Long term (current) use of oral hypoglycemic drugs: Secondary | ICD-10-CM | POA: Insufficient documentation

## 2019-09-08 DIAGNOSIS — Z6841 Body Mass Index (BMI) 40.0 and over, adult: Secondary | ICD-10-CM | POA: Insufficient documentation

## 2019-09-08 DIAGNOSIS — Z79899 Other long term (current) drug therapy: Secondary | ICD-10-CM | POA: Insufficient documentation

## 2019-09-08 DIAGNOSIS — I10 Essential (primary) hypertension: Secondary | ICD-10-CM | POA: Diagnosis not present

## 2019-09-08 DIAGNOSIS — E119 Type 2 diabetes mellitus without complications: Secondary | ICD-10-CM | POA: Diagnosis not present

## 2019-09-08 DIAGNOSIS — Z08 Encounter for follow-up examination after completed treatment for malignant neoplasm: Secondary | ICD-10-CM | POA: Diagnosis not present

## 2019-09-08 DIAGNOSIS — Z8542 Personal history of malignant neoplasm of other parts of uterus: Secondary | ICD-10-CM | POA: Diagnosis not present

## 2019-09-08 DIAGNOSIS — Z90722 Acquired absence of ovaries, bilateral: Secondary | ICD-10-CM | POA: Insufficient documentation

## 2019-09-08 DIAGNOSIS — E0590 Thyrotoxicosis, unspecified without thyrotoxic crisis or storm: Secondary | ICD-10-CM | POA: Diagnosis not present

## 2019-09-08 DIAGNOSIS — Z90710 Acquired absence of both cervix and uterus: Secondary | ICD-10-CM | POA: Diagnosis not present

## 2019-09-08 NOTE — Patient Instructions (Signed)
Please schedule an appointment with either Dr. Leonides Schanz or Dr. Ouida Sills in 6 months (around 03/07/2020) and then we will plan to see you back a year from now. Please call if any concerning symptoms in the interim and we will see you sooner.

## 2019-09-08 NOTE — Progress Notes (Signed)
Gynecologic Oncology Interval Visit   Referring Provider: Dr. Schermerhorn  Chief Complaint: Endometrial Cancer, grade 1  Subjective:  Cassandra Sparks is a 48 y.o. G3P2 female who is seen in consultation from Dr. Schermerhorn for endometrial cancer.    Gynecologic Oncology History  Cassandra Sparks is a pleasant G3P2 female with stage Ia, grade 1 who is seen in consultation from Dr. Schermerhorn for endometrial cancer. Her history is noted below.   Patient initially presented as referral from PCP/Dr. Miles for spotting over past several months. She saw Dr. Schermerhorn on 01/04/2019. Pap was performed but results not yet available. Endometrial biopsy was performed which revealed:   Diagnosis:  Endometrium, Biopsy:  - well differentiated endometrioid carcinoma with mucinous differentiation (FIGO I)  On 01/20/2019 she underwent exam under anesthesia, robotic assisted laparoscopic hysterectomy, bilateral salpingo-oophorectomy; lysis of adhesions (30 minutes); sentinel node injection, mapping, and bilateral pelvic sentinel lymph node biopsy; and washings with Dr. Ward.   Final pathology:  Histologic Type: Endometrioid carcinoma, with mucinous differentiation  Histologic Grade: FIGO grade 1   Myometrial Invasion: present, MELF type    Depth of Myometrial Invasion (millimeters): 1 mm    Myometrial Thickness (millimeters): 13 mm    Percentage of Myometrial Invasion: 8%   Uterine Serosa Involvement: Not identified  Cervical Stromal Involvement: Not identified  Other Tissue/Organ Involvement: Not identified   MLH1: Intact nuclear expression  MSH2: Intact nuclear expression  MSH6: Intact nuclear expression  PMS2: Intact nuclear expression   Washings: negative    Problem List: Patient Active Problem List   Diagnosis Date Noted  . Morbid obesity with BMI of 40.0-44.9, adult (HCC) 02/06/2019  . Post-operative state 01/20/2019  . Carpal tunnel syndrome 01/13/2019  .  Endometrial cancer (HCC) 01/13/2019  . Hyperthyroidism 06/16/2018  . Bilateral foot pain 05/07/2016  . Numbness 05/07/2016  . Weakness of lower extremity 05/07/2016    Past Medical History: Past Medical History:  Diagnosis Date  . Anemia   . Diabetes mellitus without complication (HCC)   . Hypertension   . Hyperthyroidism   . Primary endometrioid carcinoma of endometrium of uterine body (HCC)   . Thyroid disease     Past Surgical History: Past Surgical History:  Procedure Laterality Date  . ABDOMINAL HYSTERECTOMY    . BREAST BIOPSY Right 2013   neg  . DILATION AND CURETTAGE, DIAGNOSTIC / THERAPEUTIC    . ECTOPIC PREGNANCY SURGERY    . ROBOTIC ASSISTED TOTAL HYSTERECTOMY WITH BILATERAL SALPINGO OOPHERECTOMY N/A 01/20/2019   Procedure: ROBOTIC ASSISTED TOTAL LAPAROSCOPIC HYSTERECTOMY WITH BILATERAL SALPINGO OOPHORECTOMY, PELVIC SENTINEL LYMPH NODE MAPPING, POSSIBLE PELVIC/AORTIC LYMPH NODE DISSECTION;  Surgeon: Ward, Chelsea C, MD;  Location: [facility] ORS;  Service: Gynecology;  Laterality: N/A;    Past Gynecologic History:  Menarche: age 16 Periods last ~ 3 days Menses regular Post menopausal  Family History: Family History  Problem Relation Age of Onset  . Diabetes Mother   . Breast cancer Mother   . Heart attack Father   . Stroke Father      Social History: Social History   Socioeconomic History  . Marital status: Single    Spouse name: Not on file  . Number of children: Not on file  . Years of education: Not on file  . Highest education level: Not on file  Occupational History  . Not on file  Tobacco Use  . Smoking status: Never Smoker  . Smokeless tobacco: Never Used  Substance and Sexual Activity  . Alcohol use:   No    Alcohol/week: 0.0 standard drinks  . Drug use: Never  . Sexual activity: Yes  Other Topics Concern  . Not on file  Social History Narrative  . Not on file   Social Determinants of Health   Financial Resource Strain:   . Difficulty  of Paying Living Expenses: Not on file  Food Insecurity:   . Worried About Running Out of Food in the Last Year: Not on file  . Ran Out of Food in the Last Year: Not on file  Transportation Needs:   . Lack of Transportation (Medical): Not on file  . Lack of Transportation (Non-Medical): Not on file  Physical Activity:   . Days of Exercise per Week: Not on file  . Minutes of Exercise per Session: Not on file  Stress:   . Feeling of Stress : Not on file  Social Connections:   . Frequency of Communication with Friends and Family: Not on file  . Frequency of Social Gatherings with Friends and Family: Not on file  . Attends Religious Services: Not on file  . Active Member of Clubs or Organizations: Not on file  . Attends Club or Organization Meetings: Not on file  . Marital Status: Not on file  Intimate Partner Violence:   . Fear of Current or Ex-Partner: Not on file  . Emotionally Abused: Not on file  . Physically Abused: Not on file  . Sexually Abused: Not on file    Allergies: Allergies  Allergen Reactions  . Latex Hives and Itching  . Other Swelling  . Fish Allergy Rash    Current Medications: Current Outpatient Medications  Medication Sig Dispense Refill  . docusate sodium (COLACE) 100 MG capsule Take 100 mg by mouth daily.    . ferrous sulfate (FEROSUL) 325 (65 FE) MG tablet Take 325 mg by mouth daily with breakfast.    . hydrochlorothiazide (HYDRODIURIL) 25 MG tablet Take 25 mg by mouth daily.   2  . HYDROcodone-homatropine (HYCODAN) 5-1.5 MG/5ML syrup Take 5 mLs by mouth every 6 (six) hours as needed for cough. 120 mL 0  . lisinopril (PRINIVIL,ZESTRIL) 10 MG tablet Take 10 mg by mouth daily.   2  . metFORMIN (GLUCOPHAGE-XR) 500 MG 24 hr tablet Take 1,000 mg by mouth daily with breakfast.    . methimazole (TAPAZOLE) 5 MG tablet Take 5 mg by mouth daily.    . vitamin B-12 (CYANOCOBALAMIN) 1000 MCG tablet Take 1,000 mcg by mouth daily.    . cyclobenzaprine (FLEXERIL) 10  MG tablet      No current facility-administered medications for this visit.    Review of Systems General: no complaints  HEENT: no complaints  Lungs: no complaints  Cardiac: no complaints  GI: no complaints  GU: no complaints  Musculoskeletal: no complaints  Extremities: no complaints  Skin: no complaints  Neuro: no complaints  Endocrine: no complaints  Psych: no complaints       Objective:  Physical Examination:  BP 138/90 (BP Location: Left Arm, Patient Position: Sitting)   Pulse 87   Resp 17   Wt (!) 342 lb (155.1 kg)   SpO2 100%   BMI 55.20 kg/m     ECOG Performance status: 0  GENERAL: Patient is a well appearing female in no acute distress HEENT:  PERRL, neck supple with midline trachea. Thyroid without masses.  NODES:  No cervical, supraclavicular, axillary, or inguinal lymphadenopathy palpated.  LUNGS:  Clear to auscultation bilaterally.  No wheezes or rhonchi. HEART:    Regular rate and rhythm. No murmur appreciated. ABDOMEN:  Soft, nontender.  Positive, normoactive bowel sounds.  MSK:  No focal spinal tenderness to palpation. Full range of motion bilaterally in the upper extremities. EXTREMITIES:  No peripheral edema.   SKIN:  Clear with no obvious rashes or skin changes. No nail dyscrasia. NEURO:  Nonfocal. Well oriented.  Appropriate affect.  Pelvic: EGBUS: no lesions Cervix: absent Vagina: no lesions, no discharge or bleeding. Used larger speculum. Uterus: absent Adnexa: no palpable masses Rectovaginal: confirmatory       Assessment:  Cassandra Sparks is a 48 y.o. female diagnosed with stage Ia grade 1 endometrioid endometrial cancer (pMMR). Clinicall NED   Medical co-morbidities complicating care: hyperthyroidism on methimazole, Body mass index is 55.2 kg/m., HTN, early AODM on metformin.  Plan:   Problem List Items Addressed This Visit    None    Visit Diagnoses    History of endometrial cancer    -  Primary     We reviewed her  pathology results which are very reassuring. Her risk of recurrence is < 5%. No further therapy is needed.   I have recommended continued close follow up with exams, including pelvic exams every 3-6 months for 2-3 years, then every 6-12 months for 3-5 years and then annually thereafter.  Imaging and laboratory assessment is based on clinical indication. Patient education was previously provided for obesity, lifestyle, exercise, nutrition, sexual health, vaginal lubricants. We discussed her weight and need for weight loss, stressed good nutrition, and exercise.   Plan to alternate visits with Dr. Ward or Schermerhorn after this visit. Plan for return to our clinic in one year.   A total of 25 minutes were spent with the patient/family today; >50% was spent in education, counseling and coordination of care for endometrial cancer.    Cassandra Alvarez [place], MD    CC:  Dr. Schermerhorn Dr. Ward 

## 2019-12-25 DIAGNOSIS — M25511 Pain in right shoulder: Secondary | ICD-10-CM | POA: Diagnosis not present

## 2019-12-25 DIAGNOSIS — S4991XA Unspecified injury of right shoulder and upper arm, initial encounter: Secondary | ICD-10-CM | POA: Diagnosis not present

## 2020-02-27 DIAGNOSIS — J069 Acute upper respiratory infection, unspecified: Secondary | ICD-10-CM | POA: Diagnosis not present

## 2020-02-27 DIAGNOSIS — Z03818 Encounter for observation for suspected exposure to other biological agents ruled out: Secondary | ICD-10-CM | POA: Diagnosis not present

## 2020-02-27 DIAGNOSIS — J029 Acute pharyngitis, unspecified: Secondary | ICD-10-CM | POA: Diagnosis not present

## 2020-03-24 DIAGNOSIS — I10 Essential (primary) hypertension: Secondary | ICD-10-CM | POA: Diagnosis not present

## 2020-03-24 DIAGNOSIS — R7309 Other abnormal glucose: Secondary | ICD-10-CM | POA: Diagnosis not present

## 2020-03-24 DIAGNOSIS — E039 Hypothyroidism, unspecified: Secondary | ICD-10-CM | POA: Diagnosis not present

## 2020-05-19 ENCOUNTER — Other Ambulatory Visit: Payer: Self-pay | Admitting: Family Medicine

## 2020-07-24 ENCOUNTER — Other Ambulatory Visit: Payer: Self-pay | Admitting: Internal Medicine

## 2020-07-24 DIAGNOSIS — M40202 Unspecified kyphosis, cervical region: Secondary | ICD-10-CM | POA: Diagnosis not present

## 2020-07-24 DIAGNOSIS — M25512 Pain in left shoulder: Secondary | ICD-10-CM | POA: Diagnosis not present

## 2020-07-24 DIAGNOSIS — M5412 Radiculopathy, cervical region: Secondary | ICD-10-CM | POA: Diagnosis not present

## 2020-07-26 ENCOUNTER — Other Ambulatory Visit: Payer: Self-pay | Admitting: Family Medicine

## 2020-08-01 IMAGING — CR DG CHEST 2V
2 series · 2 of 2 positions shown · non-contrast
Comparison: 04/05/2012

CLINICAL DATA: Cough shortness of breath, diagnosed with COVID.

EXAM:
CHEST - 2 VIEW

[chest pa]
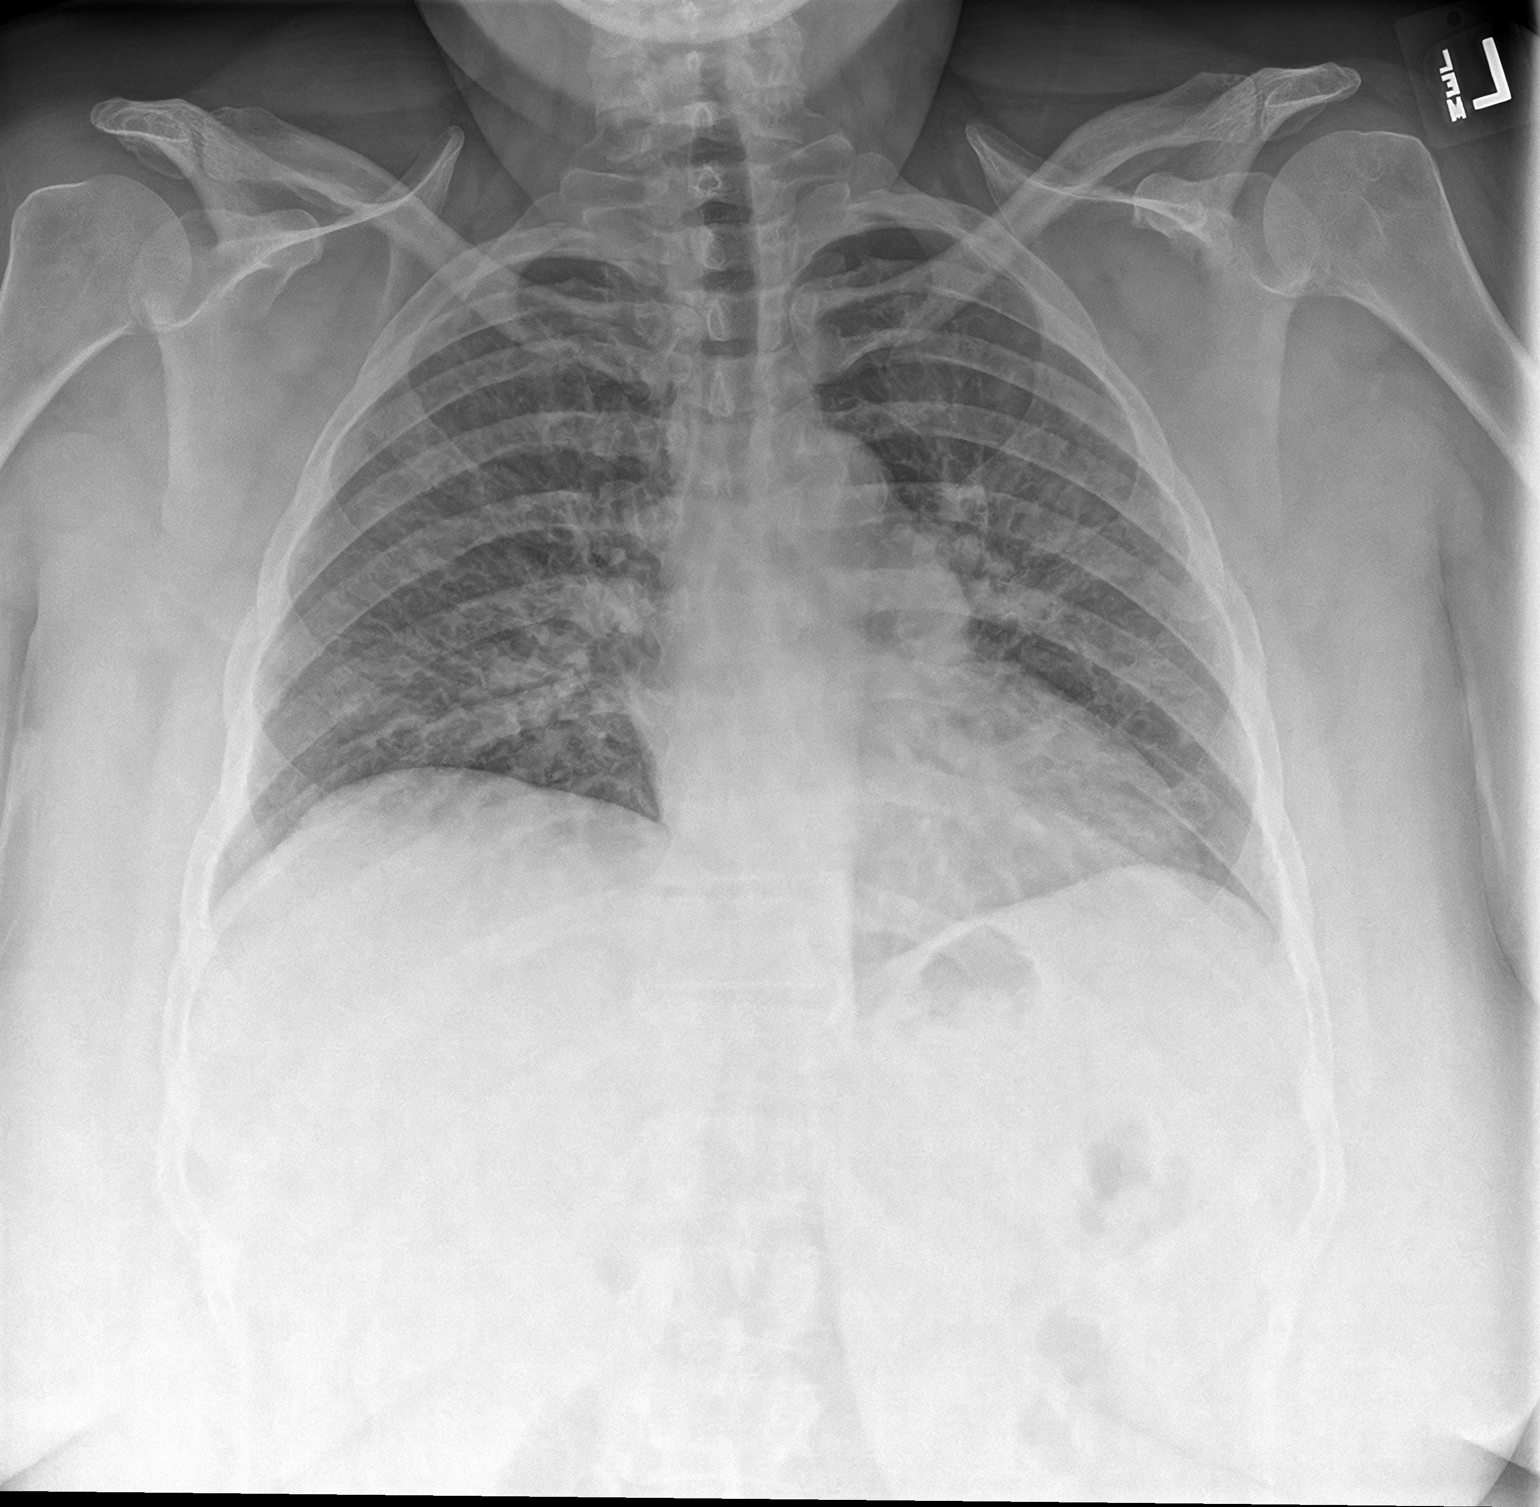

[chest lat]
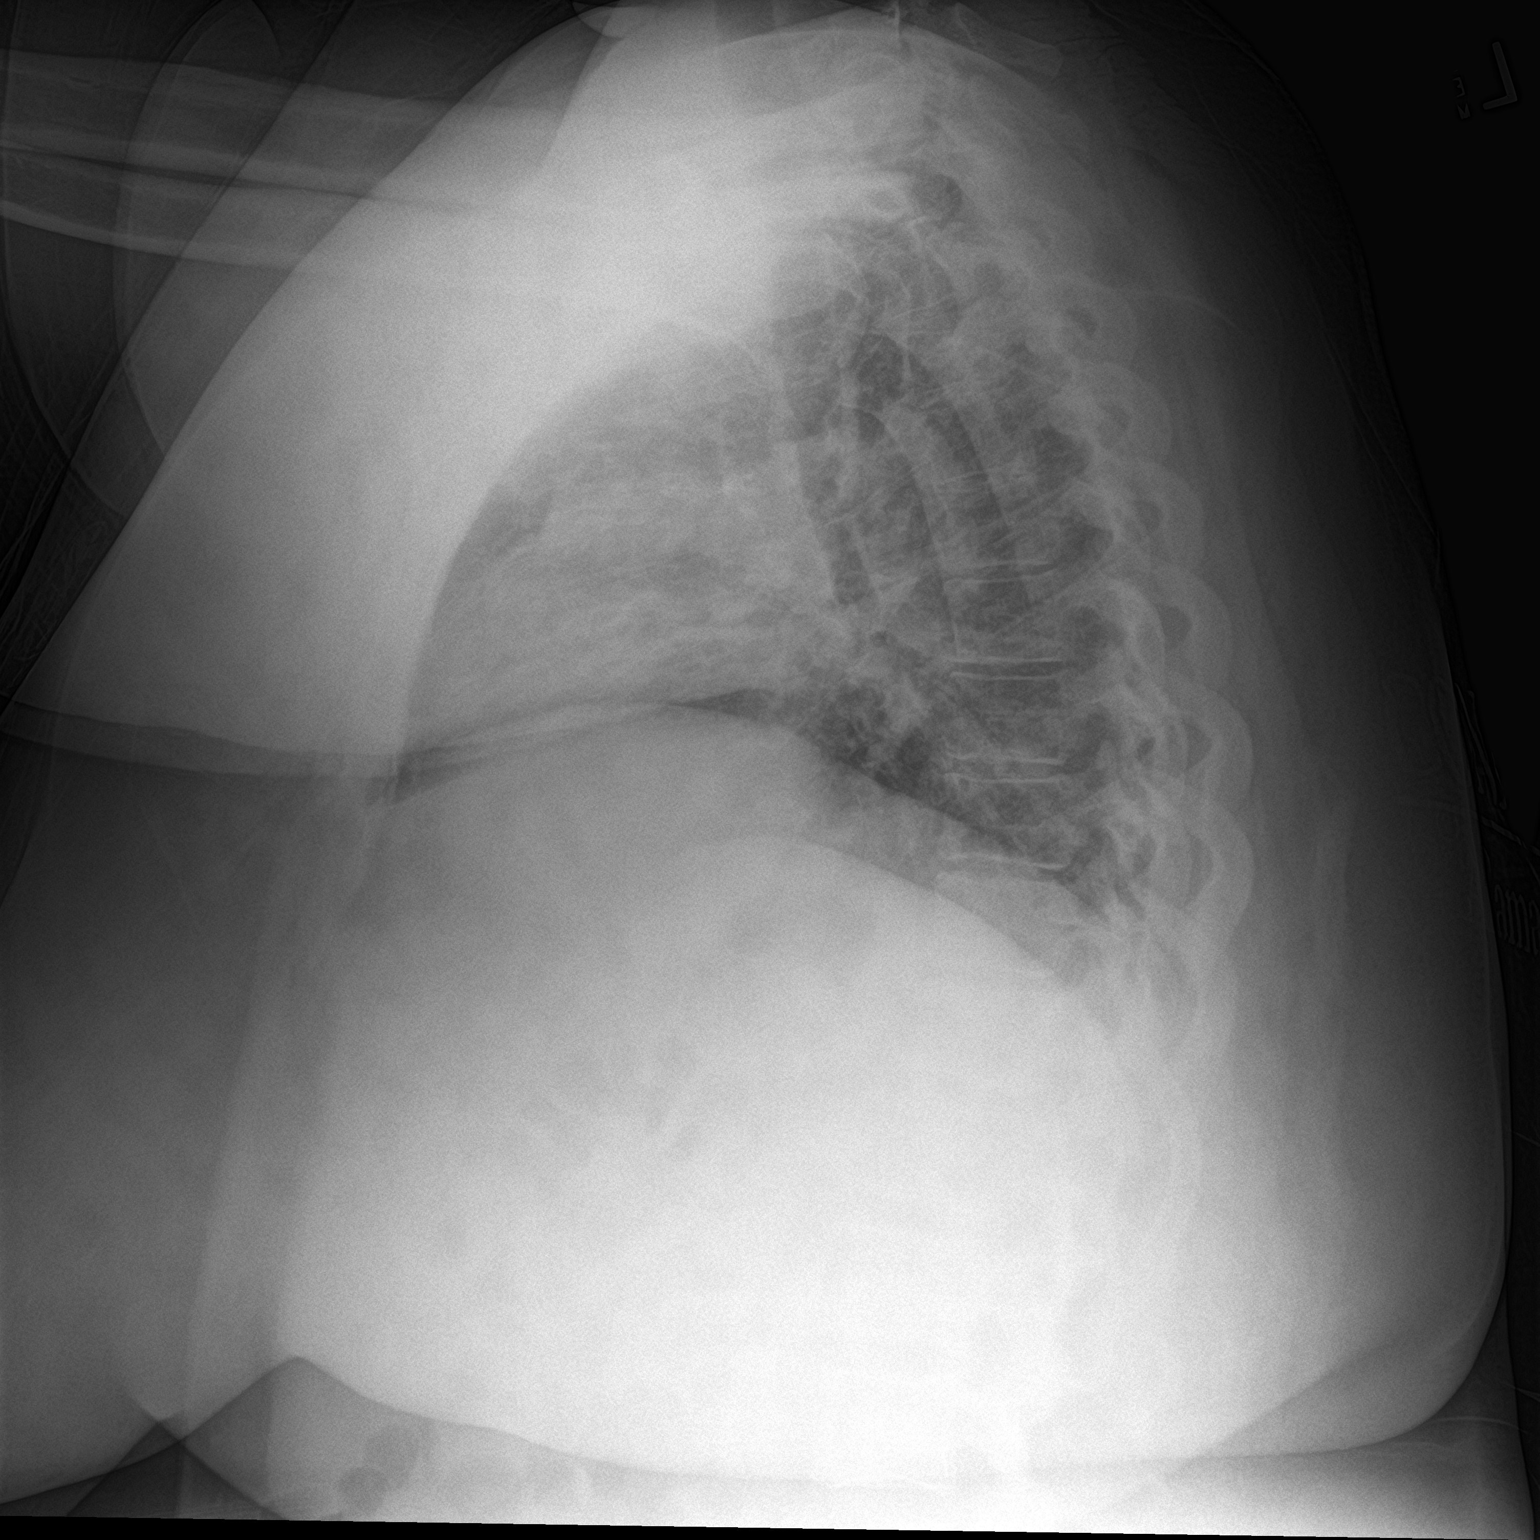

[2 of 2 positions shown; findings below may reference images not displayed]

FINDINGS: Cardiomediastinal contours are stable and normal.

Signs of subtle interstitial and airspace opacity potentially at the
lung bases without signs of dense consolidation or pleural effusion.
Lung volumes are diminished compared to the previous exam.

Visualized skeletal structures are normal.
IMPRESSION: Signs of subtle interstitial and airspace opacity potentially at the
lung bases bilaterally and mid chest on the left without signs of
dense consolidation or pleural effusion. Findings could be seen in
the setting of viral or atypical pneumonia.

## 2020-09-13 ENCOUNTER — Encounter: Payer: Self-pay | Admitting: Obstetrics and Gynecology

## 2020-09-13 ENCOUNTER — Inpatient Hospital Stay: Payer: 59 | Attending: Obstetrics and Gynecology | Admitting: Obstetrics and Gynecology

## 2020-09-13 VITALS — BP 139/92 | HR 78 | Temp 97.8°F | Resp 20 | Wt 330.0 lb

## 2020-09-13 DIAGNOSIS — Z90722 Acquired absence of ovaries, bilateral: Secondary | ICD-10-CM | POA: Insufficient documentation

## 2020-09-13 DIAGNOSIS — E0590 Thyrotoxicosis, unspecified without thyrotoxic crisis or storm: Secondary | ICD-10-CM | POA: Diagnosis not present

## 2020-09-13 DIAGNOSIS — I10 Essential (primary) hypertension: Secondary | ICD-10-CM | POA: Diagnosis not present

## 2020-09-13 DIAGNOSIS — E119 Type 2 diabetes mellitus without complications: Secondary | ICD-10-CM | POA: Insufficient documentation

## 2020-09-13 DIAGNOSIS — C541 Malignant neoplasm of endometrium: Secondary | ICD-10-CM

## 2020-09-13 DIAGNOSIS — Z6841 Body Mass Index (BMI) 40.0 and over, adult: Secondary | ICD-10-CM | POA: Diagnosis not present

## 2020-09-13 DIAGNOSIS — Z79899 Other long term (current) drug therapy: Secondary | ICD-10-CM | POA: Diagnosis not present

## 2020-09-13 DIAGNOSIS — Z8542 Personal history of malignant neoplasm of other parts of uterus: Secondary | ICD-10-CM | POA: Diagnosis not present

## 2020-09-13 DIAGNOSIS — Z90710 Acquired absence of both cervix and uterus: Secondary | ICD-10-CM | POA: Diagnosis not present

## 2020-09-13 DIAGNOSIS — Z7984 Long term (current) use of oral hypoglycemic drugs: Secondary | ICD-10-CM | POA: Insufficient documentation

## 2020-09-13 NOTE — Progress Notes (Signed)
Gynecologic Oncology Interval Visit   Referring Provider: Dr. Ouida Sills  Chief Complaint: Endometrial Cancer, grade 1  Subjective:  Cassandra Sparks is a 50 y.o. G71P2 female who is seen in consultation from Dr. Ouida Sills for endometrial cancer.   No new complaints today.    Gynecologic Oncology History  Cassandra Sparks is a pleasant G68P2 female with stage Ia, grade 1 who is seen in consultation from Dr. Ouida Sills for endometrial cancer. Her history is noted below.   Patient initially presented as referral from PCP/Dr. Mild for spotting over past several months. She saw Dr. Ouida Sills on 01/04/2019. Pap was performed but results not yet available. Endometrial biopsy was performed which revealed:   Diagnosis: Endometrium, Biopsy:  - well differentiated endometrioid carcinoma with mucinous differentiation (FIGO I)  On 01/20/2019 she underwent exam under anesthesia, robotic assisted laparoscopic hysterectomy, bilateral salpingo-oophorectomy; lysis of adhesions (30 minutes); sentinel node injection, mapping, and bilateral pelvic sentinel lymph node biopsy; and washings with Dr. Leonides Schanz.   Final pathology:  Histologic Type: Endometrioid carcinoma, with mucinous differentiation  Histologic Grade: FIGO grade 1   Myometrial Invasion: present, MELF type    Depth of Myometrial Invasion (millimeters): 1 mm    Myometrial Thickness (millimeters): 13 mm    Percentage of Myometrial Invasion: 8%   Uterine Serosa Involvement: Not identified  Cervical Stromal Involvement: Not identified  Other Tissue/Organ Involvement: Not identified   MLH1: Intact nuclear expression  MSH2: Intact nuclear expression  MSH6: Intact nuclear expression  PMS2: Intact nuclear expression   Washings: negative  Problem List: Patient Active Problem List   Diagnosis Date Noted  . Morbid obesity with BMI of 40.0-44.9, adult ([place]) 02/06/2019  . Post-operative state 01/20/2019  . Carpal tunnel syndrome  01/13/2019  . Endometrial cancer ([place]) 01/13/2019  . Hyperthyroidism 06/16/2018  . Bilateral foot pain 05/07/2016  . Numbness 05/07/2016  . Weakness of lower extremity 05/07/2016    Past Medical History: Past Medical History:  Diagnosis Date  . Anemia   . Diabetes mellitus without complication ([place])   . Hypertension   . Hyperthyroidism   . Primary endometrioid carcinoma of endometrium of uterine body ([place])   . Thyroid disease     Past Surgical History: Past Surgical History:  Procedure Laterality Date  . ABDOMINAL HYSTERECTOMY    . BREAST BIOPSY Right 2013   neg  . DILATION AND CURETTAGE, DIAGNOSTIC / THERAPEUTIC    . ECTOPIC PREGNANCY SURGERY    . ROBOTIC ASSISTED TOTAL HYSTERECTOMY WITH BILATERAL SALPINGO OOPHERECTOMY N/A 01/20/2019   Procedure: ROBOTIC ASSISTED TOTAL LAPAROSCOPIC HYSTERECTOMY WITH BILATERAL SALPINGO OOPHORECTOMY, PELVIC SENTINEL LYMPH NODE MAPPING, POSSIBLE PELVIC/AORTIC LYMPH NODE DISSECTION;  Surgeon: Ward, Honor Loh, MD;  Location: [facility] ORS;  Service: Gynecology;  Laterality: N/A;    Past Gynecologic History:  Menarche: age 23 Periods last ~ 3 days Menses regular Post menopausal  Family History: Family History  Problem Relation Age of Onset  . Diabetes Mother   . Breast cancer Mother   . Heart attack Father   . Stroke Father      Social History: Social History   Socioeconomic History  . Marital status: Single    Spouse name: Not on file  . Number of children: Not on file  . Years of education: Not on file  . Highest education level: Not on file  Occupational History  . Not on file  Tobacco Use  . Smoking status: Never Smoker  . Smokeless tobacco: Never Used  Vaping Use  . Vaping  Use: Never used  Substance and Sexual Activity  . Alcohol use: No    Alcohol/week: 0.0 standard drinks  . Drug use: Never  . Sexual activity: Yes  Other Topics Concern  . Not on file  Social History Narrative  . Not on file   Social Determinants of  Health   Financial Resource Strain: Not on file  Food Insecurity: Not on file  Transportation Needs: Not on file  Physical Activity: Not on file  Stress: Not on file  Social Connections: Not on file  Intimate Partner Violence: Not on file    Allergies: Allergies  Allergen Reactions  . Latex Hives and Itching  . Other Swelling  . Fish Allergy Rash    Current Medications: Current Outpatient Medications  Medication Sig Dispense Refill  . hydrochlorothiazide (HYDRODIURIL) 25 MG tablet Take 25 mg by mouth daily.   2  . lisinopril (PRINIVIL,ZESTRIL) 10 MG tablet Take 10 mg by mouth daily.   2  . metFORMIN (GLUCOPHAGE-XR) 500 MG 24 hr tablet Take 1,000 mg by mouth daily with breakfast.    . cyclobenzaprine (FLEXERIL) 10 MG tablet  (Patient not taking: Reported on 09/13/2020)    . docusate sodium (COLACE) 100 MG capsule Take 100 mg by mouth daily. (Patient not taking: Reported on 09/13/2020)    . ferrous sulfate 325 (65 FE) MG tablet Take 325 mg by mouth daily with breakfast. (Patient not taking: Reported on 09/13/2020)    . HYDROcodone-homatropine (HYCODAN) 5-1.5 MG/5ML syrup Take 5 mLs by mouth every 6 (six) hours as needed for cough. (Patient not taking: Reported on 09/13/2020) 120 mL 0  . methimazole (TAPAZOLE) 5 MG tablet Take 5 mg by mouth daily. (Patient not taking: Reported on 09/13/2020)    . vitamin B-12 (CYANOCOBALAMIN) 1000 MCG tablet Take 1,000 mcg by mouth daily. (Patient not taking: Reported on 09/13/2020)     No current facility-administered medications for this visit.    Review of Systems General: no complaints  HEENT: no complaints  Lungs: no complaints  Cardiac: no complaints  GI: no complaints  GU: no complaints  Musculoskeletal: no complaints  Extremities: no complaints  Skin: no complaints  Neuro: no complaints  Endocrine: no complaints  Psych: no complaints       Objective:  Physical Examination:  BP (!) 139/92   Pulse 78   Temp 97.8 F (36.6 C)    Resp 20   Wt (!) 330 lb (149.7 kg)   SpO2 100%   BMI 53.26 kg/m     ECOG Performance status: 0  GENERAL: Patient is a well appearing female in no acute distress HEENT:  PERRL, neck supple with midline trachea. Thyroid without masses.  NODES:  No cervical, supraclavicular, axillary, or inguinal lymphadenopathy palpated.  LUNGS:  Clear to auscultation bilaterally.  No wheezes or rhonchi. HEART:  Regular rate and rhythm. No murmur appreciated. ABDOMEN:  Soft, nontender.  Positive, normoactive bowel sounds.  MSK:  No focal spinal tenderness to palpation. Full range of motion bilaterally in the upper extremities. EXTREMITIES:  No peripheral edema.   SKIN:  Clear with no obvious rashes or skin changes. No nail dyscrasia. NEURO:  Nonfocal. Well oriented.  Appropriate affect.  Pelvic: EGBUS: no lesions Cervix: absent Vagina: no lesions, no discharge or bleeding. Used larger speculum. Uterus: absent Adnexa: no palpable masses Rectovaginal: confirmatory     Assessment:  Cassandra Sparks is a 50 y.o. female diagnosed with stage Ia grade 1 endometrioid endometrial cancer with 1 mm invasion (  pMMR) s/p surgery in 7/20. Clinically NED  Medical co-morbidities complicating care: hyperthyroidism on methimazole, Body mass index is 53.26 kg/m., HTN, early AODM on metformin.  Plan:   Problem List Items Addressed This Visit      Genitourinary   Endometrial cancer ([place]) - Primary (Chronic)     I have recommended continued close follow up with exams, including pelvic exams every 6 months for 2-3 years, then every 6-12 months for 3-5 years and then annually thereafter.  Imaging and laboratory assessment is based on clinical indication. Patient education was previously provided for obesity, lifestyle, exercise, nutrition, sexual health, vaginal lubricants. We discussed her weight and need for weight loss, stressed good nutrition, and exercise.   Plan to alternate visits with Dr. Leonides Schanz or Schermerhorn  and she will see them in 6 months.  Plan for return to our clinic in one year.   A total of 25 minutes were spent with the patient/family today; >50% was spent in education, counseling and coordination of care for endometrial cancer.    Mellody Drown, MD  CC:  Dr. Ouida Sills Dr. Leonides Schanz

## 2020-09-13 NOTE — Progress Notes (Signed)
deleted

## 2020-09-13 NOTE — Patient Instructions (Signed)
Please contact Dr. Guido Sander office for surveillance visit in 6 months (around 03/13/2021).

## 2020-10-23 ENCOUNTER — Other Ambulatory Visit: Payer: Self-pay

## 2020-10-23 MED FILL — Liraglutide (Weight Mngmt) Soln Pen-Inj 18 MG/3ML (6 MG/ML): SUBCUTANEOUS | 30 days supply | Qty: 15 | Fill #0 | Status: CN

## 2020-10-24 ENCOUNTER — Other Ambulatory Visit: Payer: Self-pay

## 2020-10-24 MED FILL — Lisinopril Tab 10 MG: ORAL | 30 days supply | Qty: 30 | Fill #0 | Status: AC

## 2020-10-24 MED FILL — Hydrochlorothiazide Tab 25 MG: ORAL | 30 days supply | Qty: 30 | Fill #0 | Status: AC

## 2020-10-24 MED FILL — Hydrochlorothiazide Tab 25 MG: ORAL | 30 days supply | Qty: 30 | Fill #0 | Status: CN

## 2020-10-24 MED FILL — Metformin HCl Tab ER 24HR 500 MG: ORAL | 30 days supply | Qty: 60 | Fill #0 | Status: AC

## 2020-10-25 ENCOUNTER — Other Ambulatory Visit: Payer: Self-pay

## 2020-10-31 ENCOUNTER — Other Ambulatory Visit: Payer: Self-pay

## 2020-10-31 MED FILL — Liraglutide (Weight Mngmt) Soln Pen-Inj 18 MG/3ML (6 MG/ML): SUBCUTANEOUS | 30 days supply | Qty: 15 | Fill #0 | Status: CN

## 2020-11-24 ENCOUNTER — Other Ambulatory Visit: Payer: Self-pay

## 2020-11-24 MED FILL — Lisinopril Tab 10 MG: ORAL | 30 days supply | Qty: 30 | Fill #1 | Status: AC

## 2020-11-24 MED FILL — Metformin HCl Tab ER 24HR 500 MG: ORAL | 30 days supply | Qty: 60 | Fill #1 | Status: AC

## 2020-11-24 MED FILL — Hydrochlorothiazide Tab 25 MG: ORAL | 30 days supply | Qty: 30 | Fill #1 | Status: AC

## 2020-12-11 DIAGNOSIS — I10 Essential (primary) hypertension: Secondary | ICD-10-CM | POA: Diagnosis not present

## 2020-12-11 DIAGNOSIS — Z0000 Encounter for general adult medical examination without abnormal findings: Secondary | ICD-10-CM | POA: Diagnosis not present

## 2020-12-11 DIAGNOSIS — E039 Hypothyroidism, unspecified: Secondary | ICD-10-CM | POA: Diagnosis not present

## 2020-12-11 DIAGNOSIS — R7309 Other abnormal glucose: Secondary | ICD-10-CM | POA: Diagnosis not present

## 2020-12-11 DIAGNOSIS — Z1389 Encounter for screening for other disorder: Secondary | ICD-10-CM | POA: Diagnosis not present

## 2020-12-12 DIAGNOSIS — H524 Presbyopia: Secondary | ICD-10-CM | POA: Diagnosis not present

## 2020-12-13 ENCOUNTER — Other Ambulatory Visit: Payer: Self-pay | Admitting: Family Medicine

## 2020-12-13 DIAGNOSIS — Z1231 Encounter for screening mammogram for malignant neoplasm of breast: Secondary | ICD-10-CM

## 2020-12-29 ENCOUNTER — Other Ambulatory Visit: Payer: Self-pay

## 2020-12-29 MED FILL — Metformin HCl Tab ER 24HR 500 MG: ORAL | 30 days supply | Qty: 60 | Fill #2 | Status: AC

## 2020-12-30 MED ORDER — HYDROCHLOROTHIAZIDE 25 MG PO TABS
ORAL_TABLET | Freq: Every day | ORAL | 4 refills | Status: DC
  Filled 2020-12-30: qty 30, 30d supply, fill #0
  Filled 2021-01-29: qty 30, 30d supply, fill #1
  Filled 2021-03-06: qty 30, 30d supply, fill #2
  Filled 2021-04-10: qty 30, 30d supply, fill #3
  Filled 2021-05-15: qty 30, 30d supply, fill #4

## 2020-12-30 MED ORDER — LISINOPRIL 10 MG PO TABS
ORAL_TABLET | Freq: Every day | ORAL | 4 refills | Status: DC
  Filled 2020-12-30: qty 30, 30d supply, fill #0
  Filled 2021-01-29: qty 30, 30d supply, fill #1
  Filled 2021-03-06: qty 30, 30d supply, fill #2
  Filled 2021-04-10: qty 30, 30d supply, fill #3
  Filled 2021-05-15: qty 30, 30d supply, fill #4

## 2021-01-01 ENCOUNTER — Other Ambulatory Visit: Payer: Self-pay

## 2021-01-01 DIAGNOSIS — Z1211 Encounter for screening for malignant neoplasm of colon: Secondary | ICD-10-CM | POA: Diagnosis not present

## 2021-01-16 ENCOUNTER — Other Ambulatory Visit: Payer: Self-pay

## 2021-01-16 ENCOUNTER — Ambulatory Visit
Admission: RE | Admit: 2021-01-16 | Discharge: 2021-01-16 | Disposition: A | Discharge status: Home / Self Care | Payer: 59 | Source: Ambulatory Visit | Attending: Family Medicine | Admitting: Family Medicine

## 2021-01-16 DIAGNOSIS — Z1231 Encounter for screening mammogram for malignant neoplasm of breast: Secondary | ICD-10-CM | POA: Insufficient documentation

## 2021-01-18 ENCOUNTER — Emergency Department: Payer: 59

## 2021-01-18 ENCOUNTER — Encounter: Payer: Self-pay | Admitting: Emergency Medicine

## 2021-01-18 ENCOUNTER — Other Ambulatory Visit: Payer: Self-pay

## 2021-01-18 ENCOUNTER — Emergency Department
Admission: EM | Admit: 2021-01-18 | Discharge: 2021-01-18 | Disposition: A | Discharge status: Home / Self Care | Payer: 59 | Attending: Emergency Medicine | Admitting: Emergency Medicine

## 2021-01-18 DIAGNOSIS — Z91040 Latex allergy status: Secondary | ICD-10-CM | POA: Insufficient documentation

## 2021-01-18 DIAGNOSIS — R1013 Epigastric pain: Secondary | ICD-10-CM | POA: Diagnosis not present

## 2021-01-18 DIAGNOSIS — R1310 Dysphagia, unspecified: Secondary | ICD-10-CM | POA: Insufficient documentation

## 2021-01-18 DIAGNOSIS — R079 Chest pain, unspecified: Secondary | ICD-10-CM | POA: Diagnosis not present

## 2021-01-18 DIAGNOSIS — R0789 Other chest pain: Secondary | ICD-10-CM | POA: Diagnosis not present

## 2021-01-18 DIAGNOSIS — I10 Essential (primary) hypertension: Secondary | ICD-10-CM | POA: Insufficient documentation

## 2021-01-18 DIAGNOSIS — E119 Type 2 diabetes mellitus without complications: Secondary | ICD-10-CM | POA: Insufficient documentation

## 2021-01-18 DIAGNOSIS — Z7984 Long term (current) use of oral hypoglycemic drugs: Secondary | ICD-10-CM | POA: Insufficient documentation

## 2021-01-18 DIAGNOSIS — I959 Hypotension, unspecified: Secondary | ICD-10-CM | POA: Diagnosis not present

## 2021-01-18 DIAGNOSIS — Z79899 Other long term (current) drug therapy: Secondary | ICD-10-CM | POA: Insufficient documentation

## 2021-01-18 DIAGNOSIS — Z794 Long term (current) use of insulin: Secondary | ICD-10-CM | POA: Diagnosis not present

## 2021-01-18 LAB — HEPATIC FUNCTION PANEL
ALT: 14 U/L (ref 0–44)
AST: 15 U/L (ref 15–41)
Albumin: 3.4 g/dL — ABNORMAL LOW (ref 3.5–5.0)
Alkaline Phosphatase: 64 U/L (ref 38–126)
Bilirubin, Direct: <0.1 mg/dL (ref 0.0–0.2)
Total Bilirubin: 0.5 mg/dL (ref 0.3–1.2)
Total Protein: 7 g/dL (ref 6.5–8.1)

## 2021-01-18 LAB — CBC
HCT: 36.8 % (ref 36.0–46.0)
Hemoglobin: 11.9 g/dL — ABNORMAL LOW (ref 12.0–15.0)
MCH: 26.2 pg (ref 26.0–34.0)
MCHC: 32.3 g/dL (ref 30.0–36.0)
MCV: 80.9 fL (ref 80.0–100.0)
Platelets: 282 10*3/uL (ref 150–400)
RBC: 4.55 MIL/uL (ref 3.87–5.11)
RDW: 13.3 % (ref 11.5–15.5)
WBC: 5.9 10*3/uL (ref 4.0–10.5)
nRBC: 0 % (ref 0.0–0.2)

## 2021-01-18 LAB — BASIC METABOLIC PANEL
Anion gap: 9 (ref 5–15)
BUN: 16 mg/dL (ref 6–20)
CO2: 29 mmol/L (ref 22–32)
Calcium: 9.4 mg/dL (ref 8.9–10.3)
Chloride: 102 mmol/L (ref 98–111)
Creatinine, Ser: 0.54 mg/dL (ref 0.44–1.00)
GFR, Estimated: >60 mL/min (ref >60)
Glucose, Bld: 96 mg/dL (ref 70–99)
Potassium: 3.8 mmol/L (ref 3.5–5.1)
Sodium: 140 mmol/L (ref 135–145)

## 2021-01-18 LAB — LIPASE, BLOOD: Lipase: 23 U/L (ref 11–51)

## 2021-01-18 LAB — TROPONIN I (HIGH SENSITIVITY): Troponin I (High Sensitivity): 3 ng/L (ref <18)

## 2021-01-18 MED ORDER — SUCRALFATE 1 G PO TABS
1.0000 g | ORAL_TABLET | Freq: Four times a day (QID) | ORAL | 1 refills | Status: DC
  Filled 2021-01-19: qty 20, 5d supply, fill #0
  Filled 2021-01-19: qty 100, 25d supply, fill #0

## 2021-01-18 MED ORDER — FAMOTIDINE 20 MG PO TABS
40.0000 mg | ORAL_TABLET | Freq: Once | ORAL | Status: AC
  Administered 2021-01-18: 40 mg via ORAL
  Filled 2021-01-18: qty 2

## 2021-01-18 MED ORDER — FAMOTIDINE 20 MG PO TABS
20.0000 mg | ORAL_TABLET | Freq: Two times a day (BID) | ORAL | 0 refills | Status: DC
  Filled 2021-01-19: qty 60, 30d supply, fill #0

## 2021-01-18 MED ORDER — ALUM & MAG HYDROXIDE-SIMETH 200-200-20 MG/5ML PO SUSP
30.0000 mL | Freq: Once | ORAL | Status: AC
  Administered 2021-01-18: 30 mL via ORAL
  Filled 2021-01-18: qty 30

## 2021-01-18 NOTE — ED Provider Notes (Signed)
[facility] Emergency Department Provider Note  ____________________________________________  Time seen: Approximately 6:19 PM  I have reviewed the triage vital signs and the nursing notes.   HISTORY  Chief Complaint Chest Pain and Hypotension    HPI Cassandra Sparks is a 50 y.o. female with past medical history of hypertension diabetes hypothyroidism and obesity who comes ED complaining of epigastric pain for the past 2 days, occurs when she swallows food.  Last just a few seconds at a time.  No alleviating factors, nonradiating.  Moderate intensity.  Denies exertional symptoms or shortness of breath.  No pleuritic symptoms.  No cough or fever.  She was concerned her blood pressure was low, but no orthostatic symptoms or syncope.    Past Medical History:  Diagnosis Date   Anemia    Diabetes mellitus without complication ([place])    Hypertension    Hyperthyroidism    Primary endometrioid carcinoma of endometrium of uterine body ([place])    Thyroid disease      Patient Active Problem List   Diagnosis Date Noted   Morbid obesity with BMI of 40.0-44.9, adult ([place]) 02/06/2019   Post-operative state 01/20/2019   Carpal tunnel syndrome 01/13/2019   Endometrial cancer ([place]) 01/13/2019   Hyperthyroidism 06/16/2018   Bilateral foot pain 05/07/2016   Numbness 05/07/2016   Weakness of lower extremity 05/07/2016     Past Surgical History:  Procedure Laterality Date   ABDOMINAL HYSTERECTOMY     BREAST BIOPSY Right 2013   neg   DILATION AND CURETTAGE, DIAGNOSTIC / THERAPEUTIC     ECTOPIC PREGNANCY SURGERY     ROBOTIC ASSISTED TOTAL HYSTERECTOMY WITH BILATERAL SALPINGO OOPHERECTOMY N/A 01/20/2019   Procedure: ROBOTIC ASSISTED TOTAL LAPAROSCOPIC HYSTERECTOMY WITH BILATERAL SALPINGO OOPHORECTOMY, PELVIC SENTINEL LYMPH NODE MAPPING, POSSIBLE PELVIC/AORTIC LYMPH NODE DISSECTION;  Surgeon: Ward, Honor Loh, MD;  Location: [facility] ORS;  Service: Gynecology;  Laterality: N/A;      Prior to Admission medications   Medication Sig Start Date End Date Taking? Authorizing Provider  famotidine (PEPCID) 20 MG tablet Take 1 tablet (20 mg total) by mouth 2 (two) times daily. 01/18/21  Yes Carrie Mew, MD  sucralfate (CARAFATE) 1 g tablet Take 1 tablet (1 g total) by mouth 4 (four) times daily. 01/18/21  Yes Carrie Mew, MD  cyclobenzaprine (FLEXERIL) 10 MG tablet  07/27/19   [provider]  docusate sodium (COLACE) 100 MG capsule Take 100 mg by mouth daily. Patient not taking: Reported on 09/13/2020    [provider]  ferrous sulfate 325 (65 FE) MG tablet Take 325 mg by mouth daily with breakfast. Patient not taking: Reported on 09/13/2020    [provider]  hydrochlorothiazide (HYDRODIURIL) 25 MG tablet Take 25 mg by mouth daily.  08/03/15   [provider]  hydrochlorothiazide (HYDRODIURIL) 25 MG tablet TAKE 1 TABLET BY MOUTH ONCE DAILY FOR HIGH BLOOD PRESSURE. 12/30/20 12/30/21    HYDROcodone-acetaminophen (NORCO/VICODIN) 5-325 MG tablet TAKE 1 TABLET BY MOUTH EVERY 6 (SIX) HOURS AS NEEDED FOR PAIN **RESERVE FOR BEDTIME** 07/24/20 01/20/21  Cyndie Chime, MD  HYDROcodone-homatropine [facility]) 5-1.5 MG/5ML syrup Take 5 mLs by mouth every 6 (six) hours as needed for cough. Patient not taking: Reported on 09/13/2020 07/29/19   Duffy Bruce, MD  Insulin Pen Needle 31G X 5 MM MISC USE AS DIRECTED FOR [facility] 05/19/20 05/19/21  Marguerita Merles, MD  Liraglutide -Weight Management 18 MG/3ML SOPN INJECT 0.6 MG SUBCUTANEOUSLY ONCE A DAY, INCREASE BY 0.6MG  EACH  WEEK UNTIL YOU REACH 3MG  ONCE DAILY 07/26/20 07/26/21  Marguerita Merles, MD  lisinopril (PRINIVIL,ZESTRIL) 10 MG tablet Take 10 mg by mouth daily.  08/03/15   [provider]  lisinopril (ZESTRIL) 10 MG tablet TAKE 1 TABLET BY MOUTH ONCE DAILY FOR HIGH BLOOD PRESSURE 12/30/20 12/30/21    metaxalone (SKELAXIN) 800 MG tablet TAKE 1 TABLET BY MOUTH 3 (THREE) TIMES DAILY AS NEEDED FOR PAIN  OR SPASM 07/24/20 07/24/21  Cyndie Chime, MD  metFORMIN (GLUCOPHAGE-XR) 500 MG 24 hr tablet Take 1,000 mg by mouth daily with breakfast. 05/17/19   [provider]  metFORMIN (GLUCOPHAGE-XR) 500 MG 24 hr tablet TAKE 2 TABLETS BY MOUTH AT BEDTIME 07/26/20 07/26/21  Marguerita Merles, MD  methimazole (TAPAZOLE) 5 MG tablet Take 5 mg by mouth daily. Patient not taking: Reported on 09/13/2020    [provider]  predniSONE (DELTASONE) 20 MG tablet TAKE 1 TABLET BY MOUTH 2 (TWO) TIMES DAILY FOR 5 DAYS. TAKE WITH FOOD. 07/24/20 07/24/21  Cyndie Chime, MD  vitamin B-12 (CYANOCOBALAMIN) 1000 MCG tablet Take 1,000 mcg by mouth daily. Patient not taking: Reported on 09/13/2020    [provider]     Allergies Latex, Other, and Fish allergy   Family History  Problem Relation Age of Onset   Diabetes Mother    Breast cancer Mother    Heart attack Father    Stroke Father     Social History Social History   Tobacco Use   Smoking status: Never   Smokeless tobacco: Never  Vaping Use   Vaping Use: Never used  Substance Use Topics   Alcohol use: No    Alcohol/week: 0.0 standard drinks   Drug use: Never    Review of Systems  Constitutional:   No fever or chills.  ENT:   No sore throat. No rhinorrhea. Cardiovascular:   No chest pain or syncope. Respiratory:   No dyspnea or cough. Gastrointestinal:   Positive epigastric pain without vomiting and diarrhea.  Musculoskeletal:   Negative for focal pain or swelling All other systems reviewed and are negative except as documented above in ROS and HPI.  ____________________________________________   PHYSICAL EXAM:  VITAL SIGNS: ED Triage Vitals [01/18/21 1417]  Enc Vitals Group     BP (!) 144/76     Pulse Rate 84     Resp 20     Temp 97.8 F (36.6 C)     Temp Source Oral     SpO2 100 %     Weight (!) 310 lb (140.6 kg)     Height 5\' 7"  (1.702 m)     Head Circumference      Peak Flow      Pain Score 3      Pain Loc      Pain Edu?      Excl. in [place]?     Vital signs reviewed, nursing assessments reviewed.   Constitutional:   Alert and oriented. Non-toxic appearance. Eyes:   Conjunctivae are normal. EOMI. PERRL. ENT      Head:   Normocephalic and atraumatic.      Nose:   Wearing a mask.      Mouth/Throat:   Wearing a mask.      Neck:   No meningismus. Full ROM. Hematological/Lymphatic/Immunilogical:   No cervical lymphadenopathy. Cardiovascular:   RRR. Symmetric bilateral radial and DP pulses.  No murmurs. Cap refill less than 2 seconds. Respiratory:   Normal respiratory effort without tachypnea/retractions.  Breath sounds are clear and equal bilaterally. No wheezes/rales/rhonchi. Gastrointestinal:   Soft and nontender. Non distended. There is no CVA tenderness.  No rebound, rigidity, or guarding. Genitourinary:   deferred Musculoskeletal:   Normal range of motion in all extremities. No joint effusions.  No lower extremity tenderness.  No edema. Neurologic:   Normal speech and language.  Motor grossly intact. No acute focal neurologic deficits are appreciated.  Skin:    Skin is warm, dry and intact. No rash noted.  No petechiae, purpura, or bullae.  ____________________________________________    LABS (pertinent positives/negatives) (all labs ordered are listed, but only abnormal results are displayed) Labs Reviewed  CBC - Abnormal; Notable for the following components:      Result Value   Hemoglobin 11.9 (*)    All other components within normal limits  BASIC METABOLIC PANEL  LIPASE, BLOOD  HEPATIC FUNCTION PANEL  POC URINE PREG, ED  TROPONIN I (HIGH SENSITIVITY)   ____________________________________________   EKG  Interpreted by me Normal sinus rhythm rate of 73, normal axis and intervals.  Poor R wave progression.  Normal ST segments.  Isolated T wave inversion in lead III which is nonspecific.  No acute ischemic changes, not consistent with right heart  strain.  ____________________________________________    RADIOLOGY  DG Chest 2 View  Result Date: 01/18/2021 CLINICAL DATA:  Epigastric pain and difficulty swallowing EXAM: CHEST - 2 VIEW COMPARISON:  None. FINDINGS: Cardiac shadow is within normal limits. The lungs are well aerated bilaterally. No focal infiltrate or effusion is seen. No bony abnormality is noted. IMPRESSION: No active cardiopulmonary disease. Electronically Signed   By: Inez Catalina M.D.   On: 01/18/2021 15:03    ____________________________________________   PROCEDURES Procedures  ____________________________________________  DIFFERENTIAL DIAGNOSIS   GERD, esophageal stricture, pancreatitis, biliary disease, gastritis  CLINICAL IMPRESSION / ASSESSMENT AND PLAN / ED COURSE  Medications ordered in the ED: Medications  famotidine (PEPCID) tablet 40 mg (40 mg Oral Given 01/18/21 1753)  alum & mag hydroxide-simeth (MAALOX/MYLANTA) 200-200-20 MG/5ML suspension 30 mL (30 mLs Oral Given 01/18/21 1754)    Pertinent labs & imaging results that were available during my care of the patient were reviewed by me and considered in my medical decision making (see chart for details).  MERI PELOT was evaluated in Emergency Department on 01/18/2021 for the symptoms described in the history of present illness. She was evaluated in the context of the global COVID-19 pandemic, which necessitated consideration that the patient might be at risk for infection with the SARS-CoV-2 virus that causes COVID-19. Institutional protocols and algorithms that pertain to the evaluation of patients at risk for COVID-19 are in a state of rapid change based on information released by regulatory bodies including the CDC and federal and state organizations. These policies and algorithms were followed during the patient's care in the ED.   Patient presents with atypical chest pain/epigastric pain which appears to be esophageal. Considering the  patient's symptoms, medical history, and physical examination today, I have low suspicion for ACS, PE, TAD, pneumothorax, carditis, mediastinitis, pneumonia, CHF, or sepsis. Vital signs unremarkable, exam unremarkable with benign abdominal exam.  Labs unremarkable.  Plan to add on LFTs and lipase, does not want to wait any longer, feels reassured and eager to be discharged home.  Counseled on soft diet and bland diet.  Start antacid therapy.  If she is able to go back to normal diet with antacids, can continue taking Pepcid.  If symptoms persist, recommend  she follow-up with GI for consideration of EGD.      ____________________________________________   FINAL CLINICAL IMPRESSION(S) / ED DIAGNOSES    Final diagnoses:  Dysphagia, unspecified type     ED Discharge Orders          Ordered    sucralfate (CARAFATE) 1 g tablet  4 times daily        01/18/21 1818    famotidine (PEPCID) 20 MG tablet  2 times daily        01/18/21 1818            Portions of this note were generated with dragon dictation software. Dictation errors may occur despite best attempts at proofreading.   Carrie Mew, MD 01/18/21 [phone]

## 2021-01-18 NOTE — ED Triage Notes (Signed)
Pt reports that he went to [facility] because she is having epigastric pain, it hurts when she swallows food and is having some acid reflux. They sent her here because of that and her B/P was low. It was 92/60

## 2021-01-18 NOTE — ED Notes (Signed)
Pt eating and drinking. States would like to go home. Just wanted to make sure her heartburn was not cardiac in nature.

## 2021-01-19 ENCOUNTER — Other Ambulatory Visit: Payer: Self-pay

## 2021-01-24 ENCOUNTER — Other Ambulatory Visit: Payer: Self-pay

## 2021-01-29 ENCOUNTER — Other Ambulatory Visit: Payer: Self-pay

## 2021-01-30 ENCOUNTER — Other Ambulatory Visit: Payer: Self-pay

## 2021-01-30 MED ORDER — METFORMIN HCL ER 500 MG PO TB24
ORAL_TABLET | Freq: Every day | ORAL | 5 refills | Status: DC
  Filled 2021-01-30: qty 60, 30d supply, fill #0
  Filled 2021-03-06: qty 60, 60d supply, fill #1
  Filled 2021-03-06: qty 60, 30d supply, fill #1
  Filled 2021-04-10: qty 60, 30d supply, fill #2
  Filled 2021-05-15: qty 60, 30d supply, fill #3
  Filled 2021-06-13: qty 60, 30d supply, fill #4
  Filled 2021-07-20: qty 60, 30d supply, fill #5

## 2021-03-06 ENCOUNTER — Other Ambulatory Visit: Payer: Self-pay

## 2021-04-10 ENCOUNTER — Other Ambulatory Visit: Payer: Self-pay

## 2021-04-19 ENCOUNTER — Other Ambulatory Visit: Payer: Self-pay

## 2021-04-19 DIAGNOSIS — Z0000 Encounter for general adult medical examination without abnormal findings: Secondary | ICD-10-CM | POA: Diagnosis not present

## 2021-04-19 DIAGNOSIS — I10 Essential (primary) hypertension: Secondary | ICD-10-CM | POA: Diagnosis not present

## 2021-04-19 DIAGNOSIS — E039 Hypothyroidism, unspecified: Secondary | ICD-10-CM | POA: Diagnosis not present

## 2021-04-19 DIAGNOSIS — K219 Gastro-esophageal reflux disease without esophagitis: Secondary | ICD-10-CM | POA: Insufficient documentation

## 2021-04-19 DIAGNOSIS — G5600 Carpal tunnel syndrome, unspecified upper limb: Secondary | ICD-10-CM | POA: Diagnosis not present

## 2021-04-19 DIAGNOSIS — Z1159 Encounter for screening for other viral diseases: Secondary | ICD-10-CM | POA: Diagnosis not present

## 2021-04-19 DIAGNOSIS — E0590 Thyrotoxicosis, unspecified without thyrotoxic crisis or storm: Secondary | ICD-10-CM | POA: Diagnosis not present

## 2021-04-19 MED ORDER — PANTOPRAZOLE SODIUM 40 MG PO TBEC
DELAYED_RELEASE_TABLET | ORAL | 2 refills | Status: DC
  Filled 2021-04-19: qty 90, 90d supply, fill #0

## 2021-04-30 DIAGNOSIS — Z03818 Encounter for observation for suspected exposure to other biological agents ruled out: Secondary | ICD-10-CM | POA: Diagnosis not present

## 2021-04-30 DIAGNOSIS — H6691 Otitis media, unspecified, right ear: Secondary | ICD-10-CM | POA: Diagnosis not present

## 2021-04-30 DIAGNOSIS — U071 COVID-19: Secondary | ICD-10-CM | POA: Diagnosis not present

## 2021-05-01 ENCOUNTER — Other Ambulatory Visit: Payer: Self-pay

## 2021-05-15 ENCOUNTER — Other Ambulatory Visit: Payer: Self-pay

## 2021-05-22 ENCOUNTER — Other Ambulatory Visit: Payer: Self-pay

## 2021-05-22 DIAGNOSIS — G5603 Carpal tunnel syndrome, bilateral upper limbs: Secondary | ICD-10-CM | POA: Diagnosis not present

## 2021-05-22 DIAGNOSIS — R200 Anesthesia of skin: Secondary | ICD-10-CM | POA: Diagnosis not present

## 2021-05-22 DIAGNOSIS — R202 Paresthesia of skin: Secondary | ICD-10-CM | POA: Diagnosis not present

## 2021-05-22 DIAGNOSIS — M79641 Pain in right hand: Secondary | ICD-10-CM | POA: Diagnosis not present

## 2021-05-22 MED ORDER — GABAPENTIN 100 MG PO CAPS
ORAL_CAPSULE | ORAL | 3 refills | Status: DC
  Filled 2021-05-22: qty 120, 30d supply, fill #0
  Filled 2021-06-27: qty 120, 30d supply, fill #1
  Filled 2021-07-20: qty 120, 30d supply, fill #2

## 2021-05-29 DIAGNOSIS — G5603 Carpal tunnel syndrome, bilateral upper limbs: Secondary | ICD-10-CM | POA: Diagnosis not present

## 2021-06-07 DIAGNOSIS — G5603 Carpal tunnel syndrome, bilateral upper limbs: Secondary | ICD-10-CM | POA: Diagnosis not present

## 2021-06-13 ENCOUNTER — Other Ambulatory Visit: Payer: Self-pay

## 2021-06-15 ENCOUNTER — Other Ambulatory Visit: Payer: Self-pay

## 2021-06-18 ENCOUNTER — Other Ambulatory Visit: Payer: Self-pay

## 2021-06-18 MED ORDER — LISINOPRIL 10 MG PO TABS
ORAL_TABLET | Freq: Every day | ORAL | 5 refills | Status: DC
  Filled 2021-06-18: qty 30, 30d supply, fill #0
  Filled 2021-07-20: qty 30, 30d supply, fill #1
  Filled 2021-08-27: qty 30, 30d supply, fill #2
  Filled 2021-10-02: qty 30, 30d supply, fill #3
  Filled 2021-11-05: qty 30, 30d supply, fill #4
  Filled 2021-12-07: qty 30, 30d supply, fill #5

## 2021-06-18 MED ORDER — HYDROCHLOROTHIAZIDE 25 MG PO TABS
ORAL_TABLET | Freq: Every day | ORAL | 5 refills | Status: DC
  Filled 2021-06-18: qty 30, 30d supply, fill #0
  Filled 2021-07-20: qty 30, 30d supply, fill #1
  Filled 2021-08-27: qty 30, 30d supply, fill #2
  Filled 2021-10-02: qty 30, 30d supply, fill #3
  Filled 2021-11-05: qty 30, 30d supply, fill #4
  Filled 2021-12-07: qty 30, 30d supply, fill #5

## 2021-06-27 ENCOUNTER — Other Ambulatory Visit: Payer: Self-pay

## 2021-07-20 ENCOUNTER — Other Ambulatory Visit: Payer: Self-pay

## 2021-07-23 ENCOUNTER — Other Ambulatory Visit: Payer: Self-pay

## 2021-07-29 DIAGNOSIS — Z03818 Encounter for observation for suspected exposure to other biological agents ruled out: Secondary | ICD-10-CM | POA: Diagnosis not present

## 2021-07-29 DIAGNOSIS — J0100 Acute maxillary sinusitis, unspecified: Secondary | ICD-10-CM | POA: Diagnosis not present

## 2021-08-22 ENCOUNTER — Other Ambulatory Visit: Payer: Self-pay

## 2021-08-22 DIAGNOSIS — M79641 Pain in right hand: Secondary | ICD-10-CM | POA: Diagnosis not present

## 2021-08-22 DIAGNOSIS — G5603 Carpal tunnel syndrome, bilateral upper limbs: Secondary | ICD-10-CM | POA: Diagnosis not present

## 2021-08-22 DIAGNOSIS — R202 Paresthesia of skin: Secondary | ICD-10-CM | POA: Diagnosis not present

## 2021-08-22 DIAGNOSIS — R200 Anesthesia of skin: Secondary | ICD-10-CM | POA: Diagnosis not present

## 2021-08-22 MED ORDER — GABAPENTIN 100 MG PO CAPS
ORAL_CAPSULE | ORAL | 3 refills | Status: DC
  Filled 2021-08-22: qty 360, 90d supply, fill #0
  Filled 2021-12-13: qty 360, 90d supply, fill #1
  Filled 2022-04-05: qty 360, 90d supply, fill #2
  Filled 2022-08-13: qty 360, 90d supply, fill #3

## 2021-08-23 ENCOUNTER — Other Ambulatory Visit: Payer: Self-pay

## 2021-08-24 ENCOUNTER — Other Ambulatory Visit: Payer: Self-pay

## 2021-08-24 MED ORDER — LIRAGLUTIDE -WEIGHT MANAGEMENT 18 MG/3ML ~~LOC~~ SOPN
PEN_INJECTOR | SUBCUTANEOUS | 2 refills | Status: DC
  Filled 2021-08-24: qty 15, 30d supply, fill #0
  Filled 2021-10-09: qty 15, 30d supply, fill #1
  Filled 2021-11-05: qty 15, 30d supply, fill #2

## 2021-08-27 ENCOUNTER — Other Ambulatory Visit: Payer: Self-pay

## 2021-08-28 ENCOUNTER — Other Ambulatory Visit: Payer: Self-pay

## 2021-08-28 MED ORDER — METFORMIN HCL ER 500 MG PO TB24
ORAL_TABLET | Freq: Every day | ORAL | 5 refills | Status: DC
  Filled 2021-08-28: qty 60, 30d supply, fill #0
  Filled 2021-10-02: qty 60, 30d supply, fill #1
  Filled 2021-11-05: qty 60, 30d supply, fill #2
  Filled 2021-12-07: qty 60, 30d supply, fill #3
  Filled 2022-01-07: qty 60, 30d supply, fill #4
  Filled 2022-02-09: qty 60, 30d supply, fill #5

## 2021-09-19 ENCOUNTER — Inpatient Hospital Stay: Payer: 59 | Attending: Obstetrics and Gynecology | Admitting: Nurse Practitioner

## 2021-09-19 ENCOUNTER — Other Ambulatory Visit: Payer: Self-pay

## 2021-09-19 VITALS — BP 125/72 | HR 81 | Temp 97.7°F | Resp 8 | Wt 326.0 lb

## 2021-09-19 DIAGNOSIS — Z8542 Personal history of malignant neoplasm of other parts of uterus: Secondary | ICD-10-CM | POA: Diagnosis not present

## 2021-09-19 DIAGNOSIS — E0590 Thyrotoxicosis, unspecified without thyrotoxic crisis or storm: Secondary | ICD-10-CM | POA: Diagnosis not present

## 2021-09-19 DIAGNOSIS — Z90710 Acquired absence of both cervix and uterus: Secondary | ICD-10-CM | POA: Diagnosis not present

## 2021-09-19 DIAGNOSIS — I10 Essential (primary) hypertension: Secondary | ICD-10-CM | POA: Insufficient documentation

## 2021-09-19 DIAGNOSIS — Z90722 Acquired absence of ovaries, bilateral: Secondary | ICD-10-CM | POA: Insufficient documentation

## 2021-09-19 DIAGNOSIS — Z08 Encounter for follow-up examination after completed treatment for malignant neoplasm: Secondary | ICD-10-CM

## 2021-09-19 DIAGNOSIS — E119 Type 2 diabetes mellitus without complications: Secondary | ICD-10-CM | POA: Diagnosis not present

## 2021-09-19 NOTE — Patient Instructions (Signed)
Please call and make appointment to see Dr. Ouida Sills in 6 months. ?

## 2021-09-19 NOTE — Progress Notes (Signed)
Gynecologic Oncology Interval Visit   Referring Provider: Dr. Ouida Sills  Chief Complaint: Endometrial Cancer, grade 1  Subjective:  Cassandra Sparks is a 51 y.o. G78P2 female who is seen in consultation from Dr. Ouida Sills for endometrial cancer.   No new complaints today.  Last seen in February 2022 and NED at that time. Interval mammogram was negative. She's not had a colonoscopy. No vaginal bleeding, pain, or discharge. Bowel movements are regular, no constipation or diarrhea. No changes in urination.   Gynecologic Oncology History  Cassandra Sparks is a pleasant G34P2 female with stage Ia, grade 1 who is seen in consultation from Dr. Ouida Sills for endometrial cancer. Her history is noted below.   Patient initially presented as referral from PCP/Dr. Mild for spotting over past several months. She saw Dr. Ouida Sills on 01/04/2019. Pap was performed but results not yet available. Endometrial biopsy was performed which revealed:   Diagnosis: Endometrium, Biopsy:  - well differentiated endometrioid carcinoma with mucinous differentiation (FIGO I)  On 01/20/2019 she underwent exam under anesthesia, robotic assisted laparoscopic hysterectomy, bilateral salpingo-oophorectomy; lysis of adhesions (30 minutes); sentinel node injection, mapping, and bilateral pelvic sentinel lymph node biopsy; and washings with Dr. Leonides Schanz.   Final pathology:  Histologic Type: Endometrioid carcinoma, with mucinous differentiation  Histologic Grade: FIGO grade 1   Myometrial Invasion: present, MELF type       Depth of Myometrial Invasion (millimeters): 1 mm       Myometrial Thickness (millimeters): 13 mm       Percentage of Myometrial Invasion: 8%   Uterine Serosa Involvement: Not identified  Cervical Stromal Involvement: Not identified  Other Tissue/Organ Involvement: Not identified   MLH1: Intact nuclear expression  MSH2: Intact nuclear expression  MSH6: Intact nuclear expression  PMS2: Intact nuclear  expression   Washings: negative  Problem List: Patient Active Problem List   Diagnosis Date Noted   Morbid obesity with BMI of 40.0-44.9, adult ([place]) 02/06/2019   Post-operative state 01/20/2019   Carpal tunnel syndrome 01/13/2019   Endometrial cancer ([place]) 01/13/2019   Hyperthyroidism 06/16/2018   Bilateral foot pain 05/07/2016   Numbness 05/07/2016   Weakness of lower extremity 05/07/2016    Past Medical History: Past Medical History:  Diagnosis Date   Anemia    Diabetes mellitus without complication ([place])    Hypertension    Hyperthyroidism    Primary endometrioid carcinoma of endometrium of uterine body ([place])    Thyroid disease     Past Surgical History: Past Surgical History:  Procedure Laterality Date   ABDOMINAL HYSTERECTOMY     BREAST BIOPSY Right 2013   neg   DILATION AND CURETTAGE, DIAGNOSTIC / THERAPEUTIC     ECTOPIC PREGNANCY SURGERY     ROBOTIC ASSISTED TOTAL HYSTERECTOMY WITH BILATERAL SALPINGO OOPHERECTOMY N/A 01/20/2019   Procedure: ROBOTIC ASSISTED TOTAL LAPAROSCOPIC HYSTERECTOMY WITH BILATERAL SALPINGO OOPHORECTOMY, PELVIC SENTINEL LYMPH NODE MAPPING, POSSIBLE PELVIC/AORTIC LYMPH NODE DISSECTION;  Surgeon: Ward, Honor Loh, MD;  Location: [facility] ORS;  Service: Gynecology;  Laterality: N/A;    Past Gynecologic History:  Menarche: age 75 Periods last ~ 3 days Menses regular Post menopausal  Family History: Family History  Problem Relation Age of Onset   Diabetes Mother    Breast cancer Mother    Heart attack Father    Stroke Father      Social History: Social History   Socioeconomic History   Marital status: Single    Spouse name: Not on file   Number of children: Not on  file   Years of education: Not on file   Highest education level: Not on file  Occupational History   Not on file  Tobacco Use   Smoking status: Never   Smokeless tobacco: Never  Vaping Use   Vaping Use: Never used  Substance and Sexual Activity   Alcohol use: No     Alcohol/week: 0.0 standard drinks   Drug use: Never   Sexual activity: Yes  Other Topics Concern   Not on file  Social History Narrative   Not on file   Social Determinants of Health   Financial Resource Strain: Not on file  Food Insecurity: Not on file  Transportation Needs: Not on file  Physical Activity: Not on file  Stress: Not on file  Social Connections: Not on file  Intimate Partner Violence: Not on file    Allergies: Allergies  Allergen Reactions   Latex Hives and Itching   Other Swelling   Fish Allergy Rash    Current Medications: Current Outpatient Medications  Medication Sig Dispense Refill   fluticasone (FLONASE) 50 MCG/ACT nasal spray Place into the nose.     cyclobenzaprine (FLEXERIL) 10 MG tablet  (Patient not taking: Reported on 09/13/2020)     docusate sodium (COLACE) 100 MG capsule Take 100 mg by mouth daily. (Patient not taking: Reported on 09/13/2020)     famotidine (PEPCID) 20 MG tablet Take 1 tablet (20 mg total) by mouth 2 (two) times daily. 60 tablet 0   ferrous sulfate 325 (65 FE) MG tablet Take 325 mg by mouth daily with breakfast. (Patient not taking: Reported on 09/13/2020)     gabapentin (NEURONTIN) 100 MG capsule Take 100 mg twice a day for one week, then increase to 200 mg(2 tablets) twice a day and continue 120 capsule 3   gabapentin (NEURONTIN) 100 MG capsule Take 200 mg (2 tablets) twice a day and continue 360 capsule 3   hydrochlorothiazide (HYDRODIURIL) 25 MG tablet Take 25 mg by mouth daily.   2   hydrochlorothiazide (HYDRODIURIL) 25 MG tablet TAKE 1 TABLET BY MOUTH ONCE DAILY FOR HIGH BLOOD PRESSURE. 30 tablet 5   HYDROcodone-homatropine (HYCODAN) 5-1.5 MG/5ML syrup Take 5 mLs by mouth every 6 (six) hours as needed for cough. (Patient not taking: Reported on 09/13/2020) 120 mL 0   LAGEVRIO 200 MG CAPS capsule Take by mouth.     Liraglutide -Weight Management (SAXENDA) 18 MG/3ML SOPN INJECT 0.6 MG SUBCUTANEOUSLY ONCE A DAY, INCREASE BY 0.6MG  EACH WEEK UNTIL YOU REACH 3MG ONCE DAILY 15 mL 2   lisinopril (PRINIVIL,ZESTRIL) 10 MG tablet Take 10 mg by mouth daily.   2   lisinopril (ZESTRIL) 10 MG tablet TAKE 1 TABLET BY MOUTH ONCE DAILY FOR HIGH BLOOD PRESSURE 30 tablet 5   metFORMIN (GLUCOPHAGE-XR) 500 MG 24 hr tablet Take 1,000 mg by mouth daily with breakfast.     metFORMIN (GLUCOPHAGE-XR) 500 MG 24 hr tablet Take by mouth at bedtime. 60 tablet 5   methimazole (TAPAZOLE) 5 MG tablet Take 5 mg by mouth daily. (Patient not taking: Reported on 09/13/2020)     pantoprazole (PROTONIX) 40 MG tablet Take by mouth. 90 tablet 2   sucralfate (CARAFATE) 1 g tablet Take 1 tablet (1 g total) by mouth 4 (four) times daily. 120 tablet 1   vitamin B-12 (CYANOCOBALAMIN) 1000 MCG tablet Take 1,000 mcg by mouth daily. (Patient not taking: Reported on 09/13/2020)     No current facility-administered medications for this visit.  Review of Systems General:  no complaints Skin: no complaints Eyes: no complaints HEENT: no complaints Breasts: no complaints Pulmonary: no complaints Cardiac: no complaints Gastrointestinal: no complaints Genitourinary/Sexual: no complaints Ob/Gyn: no complaints Musculoskeletal: no complaints Hematology: no complaints Neurologic/Psych: no complaints   Objective:  Physical Examination:  There were no vitals taken for this visit.    ECOG Performance status: 0  GENERAL: Patient is a well appearing female in no acute distress HEENT:  Sclera clear. Anicteric NODES:  Negative axillary, supraclavicular, inguinal lymph node survery LUNGS:  Clear to auscultation bilaterally.   HEART:  Regular rate and rhythm.  ABDOMEN:  Soft, nontender.  No hernias, incisions well healed. No masses or ascites EXTREMITIES:  No peripheral edema. Atraumatic. No cyanosis SKIN:  Clear with no obvious rashes or skin changes.  NEURO:  Nonfocal. Well oriented.  Appropriate affect.  Pelvic: Exam chaperoned by RN EGBUS: no lesions. Vagina:  no lesions, discharge, or bleeding. Cervix, Uterus- surgically absent. Adnexa: limited d/t body habitus but no palpable masses. Rectovaginal: deferred     Assessment:  Cassandra Sparks is a 51 y.o. female diagnosed with stage Ia grade 1 endometrioid endometrial cancer with 1 mm invasion (pMMR) s/p surgery in 7/20. Clinically NED.  Medical co-morbidities complicating care: hyperthyroidism on methimazole, There is no height or weight on file to calculate BMI., HTN, early AODM on metformin.  Plan:   Problem List Items Addressed This Visit   None  I have recommended continued close follow up with exams, including pelvic exams every 6 months for 2-3 years, then every 6-12 months for 3-5 years and then annually thereafter.  Recommended she see Dr. Ouida Sills in 6 months and we will see her back in 1 year. Imaging and laboratory assessment is based on clinical indication. Patient education was previously provided for obesity, lifestyle, exercise, nutrition, sexual health, vaginal lubricants. Encouraged balanced nutrition and physical activity. Encouraged routine health screenings including annual mammogram and colonoscopy. She will discuss and schedule with PCP.    A total of 25 minutes were spent with the patient/family today; >50% was spent in education, counseling and coordination of care for endometrial cancer.    Verlon Au, NP  CC:  Dr. Ouida Sills

## 2021-10-01 ENCOUNTER — Encounter: Payer: Self-pay | Admitting: Endocrinology

## 2021-10-01 ENCOUNTER — Other Ambulatory Visit: Payer: Self-pay

## 2021-10-01 ENCOUNTER — Ambulatory Visit: Payer: 59 | Admitting: Endocrinology

## 2021-10-01 VITALS — BP 142/84 | HR 90 | Ht 67.0 in | Wt 326.8 lb

## 2021-10-01 DIAGNOSIS — E0590 Thyrotoxicosis, unspecified without thyrotoxic crisis or storm: Secondary | ICD-10-CM | POA: Diagnosis not present

## 2021-10-01 MED ORDER — METHIMAZOLE 10 MG PO TABS
10.0000 mg | ORAL_TABLET | Freq: Every day | ORAL | 3 refills | Status: DC
  Filled 2021-10-01: qty 90, 90d supply, fill #0
  Filled 2022-01-07: qty 90, 90d supply, fill #1
  Filled 2022-02-09 – 2022-04-05 (×2): qty 90, 90d supply, fill #2
  Filled 2022-07-02: qty 90, 90d supply, fill #3

## 2021-10-01 NOTE — Progress Notes (Signed)
? ?Subjective:  ? ? Patient ID: Cassandra Sparks, female    DOB: 09-Sep-1970, 51 y.o.   MRN: [id number] ? ?HPI ?Pt is referred by Dr Lennox Grumbles, for hyperthyroidism.  Pt reports he was dx'ed with hyperthyroidism in 2019.  she took Tapazole x approx 2 years.  she has never had XRT to the anterior neck, or thyroid surgery.  she has never had thyroid imaging.  she does not consume non-prescribed thyroid medication.  she has never been on amiodarone.   ?Past Medical History:  ?Diagnosis Date  ? Anemia   ? Diabetes mellitus without complication ([place])   ? Hypertension   ? Hyperthyroidism   ? Primary endometrioid carcinoma of endometrium of uterine body ([place])   ? Thyroid disease   ? ? ?Past Surgical History:  ?Procedure Laterality Date  ? ABDOMINAL HYSTERECTOMY    ? BREAST BIOPSY Right 2013  ? neg  ? DILATION AND CURETTAGE, DIAGNOSTIC / THERAPEUTIC    ? ECTOPIC PREGNANCY SURGERY    ? ROBOTIC ASSISTED TOTAL HYSTERECTOMY WITH BILATERAL SALPINGO OOPHERECTOMY N/A 01/20/2019  ? Procedure: ROBOTIC ASSISTED TOTAL LAPAROSCOPIC HYSTERECTOMY WITH BILATERAL SALPINGO OOPHORECTOMY, PELVIC SENTINEL LYMPH NODE MAPPING, POSSIBLE PELVIC/AORTIC LYMPH NODE DISSECTION;  Surgeon: Ward, Honor Loh, MD;  Location: [facility] ORS;  Service: Gynecology;  Laterality: N/A;  ? ? ?Social History  ? ?Socioeconomic History  ? Marital status: Single  ?  Spouse name: Not on file  ? Number of children: Not on file  ? Years of education: Not on file  ? Highest education level: Not on file  ?Occupational History  ? Not on file  ?Tobacco Use  ? Smoking status: Never  ? Smokeless tobacco: Never  ?Vaping Use  ? Vaping Use: Never used  ?Substance and Sexual Activity  ? Alcohol use: No  ?  Alcohol/week: 0.0 standard drinks  ? Drug use: Never  ? Sexual activity: Yes  ?Other Topics Concern  ? Not on file  ?Social History Narrative  ? Not on file  ? ?Social Determinants of Health  ? ?Financial Resource Strain: Not on file  ?Food Insecurity: Not on file  ?Transportation Needs: Not  on file  ?Physical Activity: Not on file  ?Stress: Not on file  ?Social Connections: Not on file  ?Intimate Partner Violence: Not on file  ? ? ?Current Outpatient Medications on File Prior to Visit  ?Medication Sig Dispense Refill  ? cyclobenzaprine (FLEXERIL) 10 MG tablet     ? docusate sodium (COLACE) 100 MG capsule Take 100 mg by mouth daily.    ? famotidine (PEPCID) 20 MG tablet Take 1 tablet (20 mg total) by mouth 2 (two) times daily. 60 tablet 0  ? ferrous sulfate 325 (65 FE) MG tablet Take 325 mg by mouth daily with breakfast.    ? fluticasone (FLONASE) 50 MCG/ACT nasal spray Place into the nose.    ? gabapentin (NEURONTIN) 100 MG capsule Take 100 mg twice a day for one week, then increase to 200 mg(2 tablets) twice a day and continue 120 capsule 3  ? gabapentin (NEURONTIN) 100 MG capsule Take 200 mg (2 tablets) twice a day and continue 360 capsule 3  ? hydrochlorothiazide (HYDRODIURIL) 25 MG tablet Take 25 mg by mouth daily.   2  ? hydrochlorothiazide (HYDRODIURIL) 25 MG tablet TAKE 1 TABLET BY MOUTH ONCE DAILY FOR HIGH BLOOD PRESSURE. 30 tablet 5  ? HYDROcodone-homatropine (HYCODAN) 5-1.5 MG/5ML syrup Take 5 mLs by mouth every 6 (six) hours as needed for cough. 120 mL  0  ? LAGEVRIO 200 MG CAPS capsule Take by mouth.    ? Liraglutide -Weight Management (SAXENDA) 18 MG/3ML SOPN INJECT 0.6 MG SUBCUTANEOUSLY ONCE A DAY, INCREASE BY 0.'6MG'$  EACH WEEK UNTIL YOU REACH '3MG'$  ONCE DAILY 15 mL 2  ? lisinopril (PRINIVIL,ZESTRIL) 10 MG tablet Take 10 mg by mouth daily.   2  ? lisinopril (ZESTRIL) 10 MG tablet TAKE 1 TABLET BY MOUTH ONCE DAILY FOR HIGH BLOOD PRESSURE 30 tablet 5  ? metFORMIN (GLUCOPHAGE-XR) 500 MG 24 hr tablet Take 1,000 mg by mouth daily with breakfast.    ? metFORMIN (GLUCOPHAGE-XR) 500 MG 24 hr tablet Take by mouth at bedtime. 60 tablet 5  ? pantoprazole (PROTONIX) 40 MG tablet Take by mouth. 90 tablet 2  ? sucralfate (CARAFATE) 1 g tablet Take 1 tablet (1 g total) by mouth 4 (four) times daily. 120  tablet 1  ? vitamin B-12 (CYANOCOBALAMIN) 1000 MCG tablet Take 1,000 mcg by mouth daily.    ? ?No current facility-administered medications on file prior to visit.  ? ? ?Allergies  ?Allergen Reactions  ? Latex Hives and Itching  ? Other Swelling  ? Fish Allergy Rash  ? ? ?Family History  ?Problem Relation Age of Onset  ? Diabetes Mother   ? Breast cancer Mother   ? Heart attack Father   ? Stroke Father   ? Thyroid disease Neg Hx   ? ? ?BP (!) 142/84 (BP Location: Left Arm, Patient Position: Sitting, Cuff Size: Normal)   Pulse 90   Ht '5\' 7"'$  (1.702 m)   Wt (!) 326 lb 12.8 oz (148.2 kg)   SpO2 98%   BMI 51.18 kg/m?  ? ? ?Review of Systems ?Denies weight change, tremor, palpitations, and fever.   ?   ?Objective:  ? Physical Exam ?VS: see vs page ?GEN: no distress ?HEAD: head: no deformity ?eyes: no periorbital swelling, no proptosis ?external nose and ears are normal ?NECK: supple, thyroid is slightly and diffusely enlarged.   ?CHEST WALL: no deformity ?LUNGS: clear to auscultation ?CV: reg rate and rhythm, no murmur.  ?MUSCULOSKELETAL: gait is normal and steady ?EXTEMITIES: no deformity.  1+ bilat leg edema ?NEURO:  readily moves all 4's.  sensation is intact to touch on all 4's.  No tremor.   ?SKIN:  Normal texture and temperature.  No rash or suspicious lesion is visible.  Not diaphoretic.   ?NODES:  None palpable at the neck ?PSYCH: alert, well-oriented.  Does not appear anxious nor depressed.   ? ?outside test results are reviewed: ?(2022) TSH is undetectable. ? ?I have reviewed outside records, and summarized: ?Pt was noted to have abnormal TFT, and referred here.  It was noted in 2019 that she had 10 years of hypothyroidism, rx'ed with synthroid, but this was stopped, due to low TSH.   ? ?   ?Assessment & Plan:  ?Hyperthyroidism, uncontrolled.   ? ?Patient Instructions  ?I have sent a prescription to your pharmacy, to slow the thyroid.   ?If ever you have fever while taking methimazole, stop it and call us,  even if the reason is obvious, because of the risk of a rare side-effect. ?It is best to never miss the medication.  However, if you do miss it, next best is to double up the next time.  ?Please come back for a follow-up appointment in 1 month.   ?If you chose the Radioactive iodine treatment pill, it works like this ?We wold first check a thyroid "scan" (a  special, but easy and painless type of thyroid x ray).  you go to the x-ray department of the hospital to swallow a pill, which contains a miniscule amount of radiation.  You will not notice any symptoms from this.  You will go back to the x-ray department the next day, to lie down in front of a camera.  The results of this will be sent to me.   ?Based on the results, i hope to order for you a treatment pill of radioactive iodine.  Although it is a larger amount of radiation, you will again notice no symptoms from this.  The pill is gone from your body in a few days (during which you should stay away from other people), but takes several months to work.  Therefore, please return here approximately 6-8 weeks after the treatment.  This treatment has been available for many years, and the only known side-effect is an underactive thyroid.  It is possible that i would eventually prescribe for you a thyroid hormone pill, which is very inexpensive.  You don't have to worry about side-effects of this thyroid hormone pill, because it is the same molecule your thyroid makes.   ? ? ?

## 2021-10-01 NOTE — Patient Instructions (Addendum)
I have sent a prescription to your pharmacy, to slow the thyroid.   ?If ever you have fever while taking methimazole, stop it and call us, even if the reason is obvious, because of the risk of a rare side-effect. ?It is best to never miss the medication.  However, if you do miss it, next best is to double up the next time.  ?Please come back for a follow-up appointment in 1 month.   ?If you chose the Radioactive iodine treatment pill, it works like this ?We wold first check a thyroid "scan" (a special, but easy and painless type of thyroid x ray).  you go to the x-ray department of the hospital to swallow a pill, which contains a miniscule amount of radiation.  You will not notice any symptoms from this.  You will go back to the x-ray department the next day, to lie down in front of a camera.  The results of this will be sent to me.   ?Based on the results, i hope to order for you a treatment pill of radioactive iodine.  Although it is a larger amount of radiation, you will again notice no symptoms from this.  The pill is gone from your body in a few days (during which you should stay away from other people), but takes several months to work.  Therefore, please return here approximately 6-8 weeks after the treatment.  This treatment has been available for many years, and the only known side-effect is an underactive thyroid.  It is possible that i would eventually prescribe for you a thyroid hormone pill, which is very inexpensive.  You don't have to worry about side-effects of this thyroid hormone pill, because it is the same molecule your thyroid makes.   ?

## 2021-10-02 ENCOUNTER — Other Ambulatory Visit: Payer: Self-pay

## 2021-10-09 ENCOUNTER — Other Ambulatory Visit: Payer: Self-pay

## 2021-10-11 ENCOUNTER — Other Ambulatory Visit: Payer: Self-pay

## 2021-10-11 MED ORDER — INSULIN PEN NEEDLE 31G X 5 MM MISC
3 refills | Status: AC
  Filled 2021-10-11: qty 100, 90d supply, fill #0
  Filled 2022-02-09: qty 100, 90d supply, fill #1
  Filled 2022-08-13: qty 100, 90d supply, fill #2

## 2021-10-17 ENCOUNTER — Telehealth: Payer: Self-pay | Admitting: *Deleted

## 2021-10-17 NOTE — Telephone Encounter (Signed)
Incoming call from patient. She has an apt next wed with gyn and wants to know if this apt is still needed as she saw Lauren on 3/1. Please advise. ?

## 2021-10-24 ENCOUNTER — Ambulatory Visit: Payer: 59

## 2021-10-27 DIAGNOSIS — R2242 Localized swelling, mass and lump, left lower limb: Secondary | ICD-10-CM | POA: Diagnosis not present

## 2021-10-27 DIAGNOSIS — M79672 Pain in left foot: Secondary | ICD-10-CM | POA: Diagnosis not present

## 2021-10-27 DIAGNOSIS — M7989 Other specified soft tissue disorders: Secondary | ICD-10-CM | POA: Diagnosis not present

## 2021-11-05 ENCOUNTER — Other Ambulatory Visit: Payer: Self-pay

## 2021-11-16 DIAGNOSIS — M21612 Bunion of left foot: Secondary | ICD-10-CM | POA: Diagnosis not present

## 2021-11-21 ENCOUNTER — Ambulatory Visit: Payer: Self-pay | Admitting: Podiatry

## 2021-12-07 ENCOUNTER — Other Ambulatory Visit: Payer: Self-pay

## 2021-12-07 MED ORDER — LIRAGLUTIDE -WEIGHT MANAGEMENT 18 MG/3ML ~~LOC~~ SOPN
PEN_INJECTOR | SUBCUTANEOUS | 2 refills | Status: DC
  Filled 2021-12-07: qty 15, 30d supply, fill #0
  Filled 2022-01-04: qty 15, 30d supply, fill #1
  Filled 2022-02-09: qty 15, 30d supply, fill #2

## 2021-12-13 ENCOUNTER — Other Ambulatory Visit: Payer: Self-pay

## 2022-01-02 ENCOUNTER — Other Ambulatory Visit: Payer: Self-pay | Admitting: Family Medicine

## 2022-01-02 DIAGNOSIS — Z1231 Encounter for screening mammogram for malignant neoplasm of breast: Secondary | ICD-10-CM

## 2022-01-04 ENCOUNTER — Other Ambulatory Visit: Payer: Self-pay

## 2022-01-07 ENCOUNTER — Other Ambulatory Visit: Payer: Self-pay

## 2022-01-08 ENCOUNTER — Other Ambulatory Visit: Payer: Self-pay

## 2022-01-09 ENCOUNTER — Other Ambulatory Visit: Payer: Self-pay

## 2022-01-09 MED ORDER — HYDROCHLOROTHIAZIDE 25 MG PO TABS
ORAL_TABLET | Freq: Every day | ORAL | 5 refills | Status: DC
  Filled 2022-01-09: qty 30, 30d supply, fill #0
  Filled 2022-02-09: qty 30, 30d supply, fill #1
  Filled 2022-03-13: qty 30, 30d supply, fill #2
  Filled 2022-04-05: qty 30, 30d supply, fill #3
  Filled 2022-05-27: qty 30, 30d supply, fill #4
  Filled 2022-06-21: qty 30, 30d supply, fill #5

## 2022-01-09 MED ORDER — LISINOPRIL 10 MG PO TABS
ORAL_TABLET | Freq: Every day | ORAL | 5 refills | Status: DC
  Filled 2022-01-09: qty 30, 30d supply, fill #0
  Filled 2022-02-09: qty 30, 30d supply, fill #1
  Filled 2022-03-13: qty 30, 30d supply, fill #2
  Filled 2022-04-05: qty 30, 30d supply, fill #3
  Filled 2022-05-24: qty 30, 30d supply, fill #4
  Filled 2022-06-21: qty 30, 30d supply, fill #5

## 2022-01-10 ENCOUNTER — Other Ambulatory Visit: Payer: Self-pay

## 2022-01-10 MED ORDER — GABAPENTIN 300 MG PO CAPS
ORAL_CAPSULE | ORAL | 2 refills | Status: DC
  Filled 2022-01-10: qty 60, 30d supply, fill #0
  Filled 2022-05-27: qty 60, 30d supply, fill #1
  Filled 2022-06-21: qty 60, 30d supply, fill #2

## 2022-01-29 ENCOUNTER — Ambulatory Visit
Admission: RE | Admit: 2022-01-29 | Discharge: 2022-01-29 | Disposition: A | Discharge status: Home / Self Care | Payer: 59 | Source: Ambulatory Visit | Attending: Family Medicine | Admitting: Family Medicine

## 2022-01-29 DIAGNOSIS — Z1231 Encounter for screening mammogram for malignant neoplasm of breast: Secondary | ICD-10-CM | POA: Insufficient documentation

## 2022-02-11 ENCOUNTER — Other Ambulatory Visit: Payer: Self-pay

## 2022-02-28 ENCOUNTER — Other Ambulatory Visit: Payer: Self-pay

## 2022-02-28 MED ORDER — METHOCARBAMOL 500 MG PO TABS
500.0000 mg | ORAL_TABLET | ORAL | 10 refills | Status: DC
  Filled 2022-02-28: qty 20, 10d supply, fill #0
  Filled 2022-08-13: qty 20, 10d supply, fill #1
  Filled 2022-08-29: qty 20, 10d supply, fill #2

## 2022-03-13 ENCOUNTER — Other Ambulatory Visit: Payer: Self-pay

## 2022-03-14 ENCOUNTER — Other Ambulatory Visit: Payer: Self-pay

## 2022-03-14 MED ORDER — METFORMIN HCL ER 500 MG PO TB24
1000.0000 mg | ORAL_TABLET | Freq: Every day | ORAL | 5 refills | Status: DC
  Filled 2022-03-14: qty 60, 30d supply, fill #0
  Filled 2022-04-05: qty 60, 30d supply, fill #1
  Filled 2022-05-24: qty 60, 30d supply, fill #2
  Filled 2022-06-21: qty 60, 30d supply, fill #3
  Filled 2022-07-29: qty 60, 30d supply, fill #4
  Filled 2022-08-29 – 2022-10-08 (×2): qty 60, 30d supply, fill #5

## 2022-03-14 MED ORDER — METFORMIN HCL ER 500 MG PO TB24
ORAL_TABLET | Freq: Every day | ORAL | 5 refills | Status: DC
  Filled 2022-03-14: qty 60, 30d supply, fill #0

## 2022-04-05 ENCOUNTER — Other Ambulatory Visit: Payer: Self-pay

## 2022-04-08 ENCOUNTER — Other Ambulatory Visit: Payer: Self-pay

## 2022-05-01 ENCOUNTER — Other Ambulatory Visit: Payer: Self-pay

## 2022-05-01 DIAGNOSIS — J019 Acute sinusitis, unspecified: Secondary | ICD-10-CM | POA: Diagnosis not present

## 2022-05-01 DIAGNOSIS — Z03818 Encounter for observation for suspected exposure to other biological agents ruled out: Secondary | ICD-10-CM | POA: Diagnosis not present

## 2022-05-01 DIAGNOSIS — R112 Nausea with vomiting, unspecified: Secondary | ICD-10-CM | POA: Diagnosis not present

## 2022-05-01 DIAGNOSIS — B9689 Other specified bacterial agents as the cause of diseases classified elsewhere: Secondary | ICD-10-CM | POA: Diagnosis not present

## 2022-05-01 MED ORDER — AZITHROMYCIN 250 MG PO TABS
ORAL_TABLET | ORAL | 0 refills | Status: DC
  Filled 2022-05-01: qty 6, 5d supply, fill #0

## 2022-05-01 MED ORDER — PROMETHAZINE HCL 25 MG PO TABS
25.0000 mg | ORAL_TABLET | Freq: Four times a day (QID) | ORAL | 0 refills | Status: DC | PRN
  Filled 2022-05-01: qty 15, 4d supply, fill #0

## 2022-05-02 ENCOUNTER — Other Ambulatory Visit: Payer: Self-pay

## 2022-05-02 MED ORDER — LIRAGLUTIDE -WEIGHT MANAGEMENT 18 MG/3ML ~~LOC~~ SOPN
PEN_INJECTOR | SUBCUTANEOUS | 2 refills | Status: DC
  Filled 2022-05-02: qty 15, 30d supply, fill #0
  Filled 2022-07-02: qty 15, 30d supply, fill #1
  Filled 2022-08-13: qty 15, 30d supply, fill #2

## 2022-05-24 ENCOUNTER — Other Ambulatory Visit: Payer: Self-pay

## 2022-05-27 ENCOUNTER — Other Ambulatory Visit: Payer: Self-pay

## 2022-06-21 ENCOUNTER — Other Ambulatory Visit: Payer: Self-pay

## 2022-06-21 DIAGNOSIS — M79671 Pain in right foot: Secondary | ICD-10-CM | POA: Diagnosis not present

## 2022-06-21 DIAGNOSIS — M79604 Pain in right leg: Secondary | ICD-10-CM | POA: Diagnosis not present

## 2022-06-21 DIAGNOSIS — M722 Plantar fascial fibromatosis: Secondary | ICD-10-CM | POA: Diagnosis not present

## 2022-06-21 DIAGNOSIS — M2141 Flat foot [pes planus] (acquired), right foot: Secondary | ICD-10-CM | POA: Diagnosis not present

## 2022-06-21 MED ORDER — MELOXICAM 15 MG PO TABS
ORAL_TABLET | ORAL | 1 refills | Status: DC
  Filled 2022-06-21: qty 14, 14d supply, fill #0
  Filled 2022-07-02: qty 14, 14d supply, fill #1

## 2022-07-03 ENCOUNTER — Other Ambulatory Visit: Payer: Self-pay

## 2022-07-18 DIAGNOSIS — M898X9 Other specified disorders of bone, unspecified site: Secondary | ICD-10-CM | POA: Diagnosis not present

## 2022-07-18 DIAGNOSIS — L600 Ingrowing nail: Secondary | ICD-10-CM | POA: Diagnosis not present

## 2022-07-18 DIAGNOSIS — M722 Plantar fascial fibromatosis: Secondary | ICD-10-CM | POA: Diagnosis not present

## 2022-07-18 DIAGNOSIS — B351 Tinea unguium: Secondary | ICD-10-CM | POA: Diagnosis not present

## 2022-07-18 DIAGNOSIS — M2041 Other hammer toe(s) (acquired), right foot: Secondary | ICD-10-CM | POA: Diagnosis not present

## 2022-07-18 DIAGNOSIS — L851 Acquired keratosis [keratoderma] palmaris et plantaris: Secondary | ICD-10-CM | POA: Diagnosis not present

## 2022-07-18 DIAGNOSIS — M2042 Other hammer toe(s) (acquired), left foot: Secondary | ICD-10-CM | POA: Diagnosis not present

## 2022-07-29 ENCOUNTER — Other Ambulatory Visit: Payer: Self-pay

## 2022-07-29 MED ORDER — GABAPENTIN 300 MG PO CAPS
300.0000 mg | ORAL_CAPSULE | Freq: Two times a day (BID) | ORAL | 8 refills | Status: DC
  Filled 2022-07-29: qty 60, 30d supply, fill #0
  Filled 2022-08-29 – 2023-03-06 (×2): qty 60, 30d supply, fill #1

## 2022-07-29 MED ORDER — HYDROCHLOROTHIAZIDE 25 MG PO TABS
ORAL_TABLET | Freq: Every day | ORAL | 5 refills | Status: DC
  Filled 2022-07-29: qty 90, 90d supply, fill #0
  Filled 2022-10-16: qty 90, 90d supply, fill #1

## 2022-07-29 MED ORDER — LISINOPRIL 10 MG PO TABS
ORAL_TABLET | Freq: Every day | ORAL | 5 refills | Status: DC
  Filled 2022-07-29: qty 90, 90d supply, fill #0
  Filled 2022-10-16: qty 90, 90d supply, fill #1

## 2022-07-30 ENCOUNTER — Other Ambulatory Visit: Payer: Self-pay

## 2022-07-30 MED ORDER — GABAPENTIN 300 MG PO CAPS: 300.0000 mg | ORAL_CAPSULE | Freq: Two times a day (BID) | ORAL | 2 refills | Status: DC

## 2022-08-13 ENCOUNTER — Other Ambulatory Visit: Payer: Self-pay

## 2022-08-14 ENCOUNTER — Other Ambulatory Visit: Payer: Self-pay

## 2022-08-20 ENCOUNTER — Other Ambulatory Visit: Payer: Self-pay

## 2022-08-20 DIAGNOSIS — M25561 Pain in right knee: Secondary | ICD-10-CM | POA: Diagnosis not present

## 2022-08-20 DIAGNOSIS — G8929 Other chronic pain: Secondary | ICD-10-CM | POA: Diagnosis not present

## 2022-08-20 DIAGNOSIS — M1711 Unilateral primary osteoarthritis, right knee: Secondary | ICD-10-CM | POA: Diagnosis not present

## 2022-08-20 DIAGNOSIS — M25461 Effusion, right knee: Secondary | ICD-10-CM | POA: Diagnosis not present

## 2022-08-20 DIAGNOSIS — M79671 Pain in right foot: Secondary | ICD-10-CM | POA: Diagnosis not present

## 2022-08-20 DIAGNOSIS — M7051 Other bursitis of knee, right knee: Secondary | ICD-10-CM | POA: Diagnosis not present

## 2022-08-20 DIAGNOSIS — M722 Plantar fascial fibromatosis: Secondary | ICD-10-CM | POA: Diagnosis not present

## 2022-08-20 MED ORDER — MELOXICAM 15 MG PO TABS
15.0000 mg | ORAL_TABLET | Freq: Every day | ORAL | 0 refills | Status: DC | PRN
  Filled 2022-08-20: qty 30, 30d supply, fill #0

## 2022-08-21 ENCOUNTER — Other Ambulatory Visit: Payer: Self-pay

## 2022-08-21 MED ORDER — WEGOVY 1.7 MG/0.75ML ~~LOC~~ SOAJ
1.7000 mg | SUBCUTANEOUS | 3 refills | Status: DC
  Filled 2022-08-21 – 2022-09-17 (×6): qty 3, 28d supply, fill #0

## 2022-08-23 ENCOUNTER — Other Ambulatory Visit: Payer: Self-pay

## 2022-08-26 ENCOUNTER — Other Ambulatory Visit: Payer: Self-pay

## 2022-08-26 DIAGNOSIS — M79641 Pain in right hand: Secondary | ICD-10-CM | POA: Diagnosis not present

## 2022-08-26 DIAGNOSIS — G5603 Carpal tunnel syndrome, bilateral upper limbs: Secondary | ICD-10-CM | POA: Diagnosis not present

## 2022-08-26 DIAGNOSIS — M79642 Pain in left hand: Secondary | ICD-10-CM | POA: Diagnosis not present

## 2022-08-26 DIAGNOSIS — R200 Anesthesia of skin: Secondary | ICD-10-CM | POA: Diagnosis not present

## 2022-08-26 DIAGNOSIS — R202 Paresthesia of skin: Secondary | ICD-10-CM | POA: Diagnosis not present

## 2022-08-26 MED ORDER — GABAPENTIN 300 MG PO CAPS
300.0000 mg | ORAL_CAPSULE | Freq: Three times a day (TID) | ORAL | 3 refills | Status: DC
  Filled 2022-08-26 – 2022-10-16 (×2): qty 270, 90d supply, fill #0
  Filled 2023-02-07: qty 270, 90d supply, fill #1
  Filled 2023-05-13: qty 270, 90d supply, fill #2

## 2022-08-29 ENCOUNTER — Other Ambulatory Visit: Payer: Self-pay

## 2022-08-29 ENCOUNTER — Other Ambulatory Visit (HOSPITAL_COMMUNITY): Payer: Self-pay

## 2022-08-30 ENCOUNTER — Other Ambulatory Visit: Payer: Self-pay

## 2022-09-03 ENCOUNTER — Other Ambulatory Visit: Payer: Self-pay

## 2022-09-06 ENCOUNTER — Other Ambulatory Visit: Payer: Self-pay

## 2022-09-10 ENCOUNTER — Other Ambulatory Visit: Payer: Self-pay

## 2022-09-11 ENCOUNTER — Other Ambulatory Visit: Payer: Self-pay

## 2022-09-17 ENCOUNTER — Other Ambulatory Visit: Payer: Self-pay

## 2022-09-20 ENCOUNTER — Telehealth: Payer: Self-pay | Admitting: *Deleted

## 2022-09-20 NOTE — Telephone Encounter (Signed)
Patient needs to reschedule 3/6 appointment with GYN.

## 2022-09-20 NOTE — Telephone Encounter (Signed)
Voicemail left for her to return call for reschedule.

## 2022-09-25 ENCOUNTER — Ambulatory Visit: Payer: 59

## 2022-10-07 ENCOUNTER — Other Ambulatory Visit: Payer: Self-pay

## 2022-10-07 DIAGNOSIS — Z1389 Encounter for screening for other disorder: Secondary | ICD-10-CM | POA: Diagnosis not present

## 2022-10-07 DIAGNOSIS — C541 Malignant neoplasm of endometrium: Secondary | ICD-10-CM | POA: Diagnosis not present

## 2022-10-07 DIAGNOSIS — Z0130 Encounter for examination of blood pressure without abnormal findings: Secondary | ICD-10-CM | POA: Diagnosis not present

## 2022-10-07 DIAGNOSIS — R7309 Other abnormal glucose: Secondary | ICD-10-CM | POA: Diagnosis not present

## 2022-10-07 DIAGNOSIS — I10 Essential (primary) hypertension: Secondary | ICD-10-CM | POA: Diagnosis not present

## 2022-10-07 DIAGNOSIS — E119 Type 2 diabetes mellitus without complications: Secondary | ICD-10-CM | POA: Diagnosis not present

## 2022-10-07 DIAGNOSIS — E669 Obesity, unspecified: Secondary | ICD-10-CM | POA: Diagnosis not present

## 2022-10-07 DIAGNOSIS — E0590 Thyrotoxicosis, unspecified without thyrotoxic crisis or storm: Secondary | ICD-10-CM | POA: Diagnosis not present

## 2022-10-07 MED ORDER — WEGOVY 1.7 MG/0.75ML ~~LOC~~ SOAJ
1.7000 mg | SUBCUTANEOUS | 3 refills | Status: DC
  Filled 2022-10-07 – 2022-10-08 (×2): qty 3, 28d supply, fill #0

## 2022-10-08 ENCOUNTER — Other Ambulatory Visit: Payer: Self-pay | Admitting: Endocrinology

## 2022-10-08 ENCOUNTER — Other Ambulatory Visit: Payer: Self-pay

## 2022-10-08 DIAGNOSIS — R7309 Other abnormal glucose: Secondary | ICD-10-CM | POA: Diagnosis not present

## 2022-10-08 DIAGNOSIS — I10 Essential (primary) hypertension: Secondary | ICD-10-CM | POA: Diagnosis not present

## 2022-10-08 MED ORDER — METHIMAZOLE 10 MG PO TABS
10.0000 mg | ORAL_TABLET | Freq: Every day | ORAL | 0 refills | Status: DC
  Filled 2022-10-08: qty 30, 30d supply, fill #0

## 2022-10-16 ENCOUNTER — Inpatient Hospital Stay: Payer: Commercial Managed Care - PPO | Attending: Obstetrics and Gynecology | Admitting: Obstetrics and Gynecology

## 2022-10-16 ENCOUNTER — Other Ambulatory Visit: Payer: Self-pay

## 2022-10-16 VITALS — BP 137/66 | HR 85 | Temp 97.0°F | Resp 18 | Wt 371.0 lb

## 2022-10-16 DIAGNOSIS — Z90722 Acquired absence of ovaries, bilateral: Secondary | ICD-10-CM | POA: Insufficient documentation

## 2022-10-16 DIAGNOSIS — Z90710 Acquired absence of both cervix and uterus: Secondary | ICD-10-CM | POA: Insufficient documentation

## 2022-10-16 DIAGNOSIS — Z8542 Personal history of malignant neoplasm of other parts of uterus: Secondary | ICD-10-CM

## 2022-10-16 DIAGNOSIS — Z08 Encounter for follow-up examination after completed treatment for malignant neoplasm: Secondary | ICD-10-CM

## 2022-10-16 NOTE — Progress Notes (Signed)
Patient here for gyncology oncology appointment no new concerns voiced

## 2022-10-16 NOTE — Patient Instructions (Signed)
Please call and make an appt to see Dr. Ouida Sills in 6 months. We will see you back in one year.

## 2022-10-16 NOTE — Progress Notes (Signed)
Gynecologic Oncology Interval Visit   Referring Provider: Dr. Ouida Sills  Chief Complaint: Endometrial Cancer, grade 1  Subjective:  Cassandra Sparks is a 52 y.o. G15P2 female who is seen in consultation from Dr. Ouida Sills for endometrial cancer.   No new complaints today.  Last seen in February 2022 and NED at that time. Interval mammogram was negative. She's not had a colonoscopy. No vaginal bleeding, pain, or discharge. Bowel movements are regular, no constipation or diarrhea. No changes in urination.   Gynecologic Oncology History  Cassandra Sparks is a pleasant G48P2 female with stage Ia, grade 1 who is seen in consultation from Dr. Ouida Sills for endometrial cancer. Her history is noted below.   Patient initially presented as referral from PCP/Dr. Mild for spotting over past several months. She saw Dr. Ouida Sills on 01/04/2019. Pap was performed but results not yet available. Endometrial biopsy was performed which revealed:   Diagnosis: Endometrium, Biopsy:  - well differentiated endometrioid carcinoma with mucinous differentiation (FIGO I)  On 01/20/2019 she underwent exam under anesthesia, robotic assisted laparoscopic hysterectomy, bilateral salpingo-oophorectomy; lysis of adhesions (30 minutes); sentinel node injection, mapping, and bilateral pelvic sentinel lymph node biopsy; and washings with Dr. Leonides Schanz.   Final pathology:  Histologic Type: Endometrioid carcinoma, with mucinous differentiation  Histologic Grade: FIGO grade 1   Myometrial Invasion: present, MELF type       Depth of Myometrial Invasion (millimeters): 1 mm       Myometrial Thickness (millimeters): 13 mm       Percentage of Myometrial Invasion: 8%   Uterine Serosa Involvement: Not identified  Cervical Stromal Involvement: Not identified  Other Tissue/Organ Involvement: Not identified   MLH1: Intact nuclear expression  MSH2: Intact nuclear expression  MSH6: Intact nuclear expression  PMS2: Intact nuclear  expression   Washings: negative  Problem List: Patient Active Problem List   Diagnosis Date Noted   Morbid obesity with BMI of 40.0-44.9, adult ([place]) 02/06/2019   Post-operative state 01/20/2019   Carpal tunnel syndrome 01/13/2019   Endometrial cancer ([place]) 01/13/2019   Hyperthyroidism 06/16/2018   Bilateral foot pain 05/07/2016   Numbness 05/07/2016   Weakness of lower extremity 05/07/2016    Past Medical History: Past Medical History:  Diagnosis Date   Anemia    Diabetes mellitus without complication ([place])    Hypertension    Hyperthyroidism    Primary endometrioid carcinoma of endometrium of uterine body ([place])    Thyroid disease     Past Surgical History: Past Surgical History:  Procedure Laterality Date   ABDOMINAL HYSTERECTOMY     BREAST BIOPSY Right 2013   neg   DILATION AND CURETTAGE, DIAGNOSTIC / THERAPEUTIC     ECTOPIC PREGNANCY SURGERY     ROBOTIC ASSISTED TOTAL HYSTERECTOMY WITH BILATERAL SALPINGO OOPHERECTOMY N/A 01/20/2019   Procedure: ROBOTIC ASSISTED TOTAL LAPAROSCOPIC HYSTERECTOMY WITH BILATERAL SALPINGO OOPHORECTOMY, PELVIC SENTINEL LYMPH NODE MAPPING, POSSIBLE PELVIC/AORTIC LYMPH NODE DISSECTION;  Surgeon: Ward, Honor Loh, MD;  Location: [facility] ORS;  Service: Gynecology;  Laterality: N/A;    Past Gynecologic History:  Menarche: age 51 Periods last ~ 3 days Menses regular Post menopausal  Family History: Family History  Problem Relation Age of Onset   Diabetes Mother    Breast cancer Mother    Heart attack Father    Stroke Father    Thyroid disease Neg Hx      Social History: Social History   Socioeconomic History   Marital status: Single    Spouse name: Not on file  Number of children: Not on file   Years of education: Not on file   Highest education level: Not on file  Occupational History   Not on file  Tobacco Use   Smoking status: Never   Smokeless tobacco: Never  Vaping Use   Vaping Use: Never used  Substance and Sexual  Activity   Alcohol use: No    Alcohol/week: 0.0 standard drinks of alcohol   Drug use: Never   Sexual activity: Yes  Other Topics Concern   Not on file  Social History Narrative   Not on file   Social Determinants of Health   Financial Resource Strain: Not on file  Food Insecurity: Not on file  Transportation Needs: Not on file  Physical Activity: Not on file  Stress: Not on file  Social Connections: Not on file  Intimate Partner Violence: Not on file    Allergies: Allergies  Allergen Reactions   Latex Hives and Itching   Other Swelling   Fish Allergy Rash    Current Medications: Current Outpatient Medications  Medication Sig Dispense Refill   azithromycin (ZITHROMAX) 250 MG tablet Take 2 tablets by mouth today, then take 1 tablet by mouth daily 6 tablet 0   cyclobenzaprine (FLEXERIL) 10 MG tablet      docusate sodium (COLACE) 100 MG capsule Take 100 mg by mouth daily.     famotidine (PEPCID) 20 MG tablet Take 1 tablet (20 mg total) by mouth 2 (two) times daily. 60 tablet 0   ferrous sulfate 325 (65 FE) MG tablet Take 325 mg by mouth daily with breakfast.     fluticasone (FLONASE) 50 MCG/ACT nasal spray Place into the nose.     gabapentin (NEURONTIN) 100 MG capsule Take 100 mg twice a day for one week, then increase to 200 mg(2 tablets) twice a day and continue 120 capsule 3   gabapentin (NEURONTIN) 100 MG capsule Take 200 mg (2 tablets) twice a day and continue 360 capsule 3   gabapentin (NEURONTIN) 300 MG capsule Take 1 capsule (300 mg total) by mouth 2 (two) times daily. 60 capsule 8   gabapentin (NEURONTIN) 300 MG capsule Take 1 capsule (300 mg total) by mouth 2 (two) times daily. 60 capsule 2   gabapentin (NEURONTIN) 300 MG capsule Take 1 capsule (300 mg total) by mouth 3 (three) times daily. 270 capsule 3   hydrochlorothiazide (HYDRODIURIL) 25 MG tablet Take 25 mg by mouth daily.   2   hydrochlorothiazide (HYDRODIURIL) 25 MG tablet TAKE 1 TABLET BY MOUTH ONCE DAILY  FOR HIGH BLOOD PRESSURE. 30 tablet 5   HYDROcodone-homatropine (HYCODAN) 5-1.5 MG/5ML syrup Take 5 mLs by mouth every 6 (six) hours as needed for cough. 120 mL 0   LAGEVRIO 200 MG CAPS capsule Take by mouth.     lisinopril (PRINIVIL,ZESTRIL) 10 MG tablet Take 10 mg by mouth daily.   2   lisinopril (ZESTRIL) 10 MG tablet TAKE 1 TABLET BY MOUTH ONCE DAILY FOR HIGH BLOOD PRESSURE 30 tablet 5   meloxicam (MOBIC) 15 MG tablet Take 1 tablet (15 mg total) by mouth once daily as needed for Pain (Take with food.) for up to 14 doses 14 tablet 1   meloxicam (MOBIC) 15 MG tablet Take 1 tablet (15 mg total) by mouth daily as needed for pain with food. 30 tablet 0   metFORMIN (GLUCOPHAGE-XR) 500 MG 24 hr tablet Take 1,000 mg by mouth daily with breakfast.     metFORMIN (GLUCOPHAGE-XR) 500 MG 24 hr  tablet Take 2 tablets by mouth at bedtime. 60 tablet 5   methimazole (TAPAZOLE) 10 MG tablet Take 1 tablet (10 mg total) by mouth daily. 30 tablet 0   methocarbamol (ROBAXIN) 500 MG tablet Take 1 tablet (500 mg total) by mouth every evening and at bedtime. 20 tablet 10   pantoprazole (PROTONIX) 40 MG tablet Take by mouth. 90 tablet 2   promethazine (PHENERGAN) 25 MG tablet Take 1 tablet (25 mg total) by mouth every 6 (six) hours as needed for nausea or mild discomfort for up to 7 days. 15 tablet 0   Semaglutide-Weight Management (WEGOVY) 1.7 MG/0.75ML SOAJ Inject 1.7 mg into the skin once a week. 3 mL 3   sucralfate (CARAFATE) 1 g tablet Take 1 tablet (1 g total) by mouth 4 (four) times daily. 120 tablet 1   vitamin B-12 (CYANOCOBALAMIN) 1000 MCG tablet Take 1,000 mcg by mouth daily.     Semaglutide-Weight Management (WEGOVY) 1.7 MG/0.75ML SOAJ Inject 1.7 mg into the skin once a week. 3 mL 3   No current facility-administered medications for this visit.    Review of Systems General: no complaints  HEENT: no complaints  Lungs: no complaints  Cardiac: no complaints  GI: no complaints  GU: no complaints   Musculoskeletal: no complaints  Extremities: no complaints  Skin: no complaints  Neuro: no complaints  Endocrine: no complaints  Psych: no complaints       Objective:  Physical Examination:  BP 137/66 (Patient Position: Sitting)   Pulse 85   Temp (!) 97 F (36.1 C) (Tympanic)   Resp 18   Wt (!) 371 lb (168.3 kg)   SpO2 100%   BMI 58.11 kg/m     ECOG Performance status: 0  GENERAL: Patient is a well appearing female in no acute distress HEENT:  Atraumatic and normocephalic. PERRL, neck supple. NODES:  No cervical, supraclavicular, axillary, or inguinal lymphadenopathy palpated.  LUNGS:  Normal respiratory effort.  ABDOMEN:  Soft, nontender. Nondistended. No masses/ascites/hernia/or hepatomegaly.  EXTREMITIES:  No peripheral edema.   SKIN:  Clear with no obvious rashes or skin changes. No nail dyscrasia. NEURO:  Nonfocal. Well oriented.  Appropriate affect.  Pelvic: EGBUS: no lesions Cervix: surgically absent Vagina: no lesions, no discharge or bleeding Uterus: surgically absent BME: no palpable masses Rectovaginal: deferred      Assessment:  Cassandra Sparks is a 52 y.o. female diagnosed with stage Ia grade 1 endometrioid endometrial cancer with 1 mm invasion (pMMR) s/p surgery in 7/20. Clinically NED.  Medical co-morbidities complicating care: hyperthyroidism on methimazole, Body mass index is 58.11 kg/m., HTN, early AODM on metformin.  Plan:   Problem List Items Addressed This Visit   None Visit Diagnoses     Encounter for follow-up surveillance of endometrial cancer    -  Primary      We recommended continued close follow up with exams, including pelvic exams every 6 months for 2-3 years, then every 6-12 months for years 3-5  and then annually thereafter.    Recommended she see Dr. Ouida Sills in 6 months and we will see her back in 1 year.   Imaging and laboratory assessment is based on clinical indication. Follow up with her PCP for her other  health care issues and screening.    A total of 15 minutes were spent with the patient/family today; >50% was spent in education, counseling and coordination of care for endometrial cancer.    Flonnie Wierman Gaetana Michaelis, MD  CC:  Dr.  Schermerhorn

## 2022-10-28 ENCOUNTER — Other Ambulatory Visit: Payer: Self-pay

## 2022-10-29 ENCOUNTER — Other Ambulatory Visit: Payer: Self-pay

## 2022-10-31 ENCOUNTER — Other Ambulatory Visit: Payer: Self-pay

## 2022-10-31 MED ORDER — PHENTERMINE HCL 37.5 MG PO TABS
37.5000 mg | ORAL_TABLET | Freq: Every day | ORAL | 3 refills | Status: DC
  Filled 2022-10-31: qty 30, 30d supply, fill #0
  Filled 2022-11-25 – 2022-11-28 (×2): qty 30, 30d supply, fill #1
  Filled 2023-01-01: qty 30, 30d supply, fill #2
  Filled 2023-02-07: qty 30, 30d supply, fill #3

## 2022-11-25 ENCOUNTER — Other Ambulatory Visit: Payer: Self-pay

## 2022-11-25 MED ORDER — METFORMIN HCL ER 500 MG PO TB24
1000.0000 mg | ORAL_TABLET | Freq: Every day | ORAL | 5 refills | Status: DC
  Filled 2022-11-25: qty 60, 30d supply, fill #0
  Filled 2023-01-01: qty 60, 30d supply, fill #1
  Filled 2023-02-07: qty 60, 30d supply, fill #2
  Filled 2023-03-06: qty 60, 30d supply, fill #3
  Filled 2023-04-06: qty 60, 30d supply, fill #4
  Filled 2023-05-13: qty 60, 30d supply, fill #5

## 2022-11-25 MED ORDER — LISINOPRIL 10 MG PO TABS
10.0000 mg | ORAL_TABLET | Freq: Every day | ORAL | 5 refills | Status: DC
  Filled 2022-11-25 – 2023-02-07 (×2): qty 30, 30d supply, fill #0
  Filled 2023-03-06: qty 30, 30d supply, fill #1
  Filled 2023-04-06: qty 30, 30d supply, fill #2
  Filled 2023-05-13: qty 30, 30d supply, fill #3
  Filled 2023-06-09: qty 30, 30d supply, fill #4
  Filled 2023-07-14: qty 30, 30d supply, fill #5

## 2022-11-25 MED ORDER — HYDROCHLOROTHIAZIDE 25 MG PO TABS
25.0000 mg | ORAL_TABLET | Freq: Every day | ORAL | 5 refills | Status: DC
  Filled 2022-11-25 – 2023-02-07 (×2): qty 30, 30d supply, fill #0
  Filled 2023-03-06: qty 30, 30d supply, fill #1
  Filled 2023-04-06: qty 30, 30d supply, fill #2
  Filled 2023-05-13: qty 30, 30d supply, fill #3
  Filled 2023-06-09: qty 30, 30d supply, fill #4
  Filled 2023-07-14: qty 30, 30d supply, fill #5

## 2022-11-26 ENCOUNTER — Other Ambulatory Visit: Payer: Self-pay

## 2022-11-28 ENCOUNTER — Other Ambulatory Visit: Payer: Self-pay

## 2023-01-01 ENCOUNTER — Other Ambulatory Visit: Payer: Self-pay

## 2023-01-15 ENCOUNTER — Other Ambulatory Visit: Payer: Self-pay

## 2023-02-07 ENCOUNTER — Other Ambulatory Visit: Payer: Self-pay

## 2023-03-06 ENCOUNTER — Other Ambulatory Visit: Payer: Self-pay

## 2023-03-07 ENCOUNTER — Other Ambulatory Visit: Payer: Self-pay

## 2023-03-07 MED ORDER — PHENTERMINE HCL 37.5 MG PO TABS
37.5000 mg | ORAL_TABLET | Freq: Every day | ORAL | 3 refills | Status: DC
  Filled 2023-03-07: qty 30, 30d supply, fill #0
  Filled 2023-04-06: qty 30, 30d supply, fill #1
  Filled 2023-05-13: qty 30, 30d supply, fill #2
  Filled 2023-06-09: qty 30, 30d supply, fill #3

## 2023-03-20 DIAGNOSIS — Z0130 Encounter for examination of blood pressure without abnormal findings: Secondary | ICD-10-CM | POA: Diagnosis not present

## 2023-03-20 DIAGNOSIS — Z1389 Encounter for screening for other disorder: Secondary | ICD-10-CM | POA: Diagnosis not present

## 2023-03-20 DIAGNOSIS — E0590 Thyrotoxicosis, unspecified without thyrotoxic crisis or storm: Secondary | ICD-10-CM | POA: Diagnosis not present

## 2023-03-20 DIAGNOSIS — Z0000 Encounter for general adult medical examination without abnormal findings: Secondary | ICD-10-CM | POA: Diagnosis not present

## 2023-03-20 DIAGNOSIS — E119 Type 2 diabetes mellitus without complications: Secondary | ICD-10-CM | POA: Diagnosis not present

## 2023-04-07 ENCOUNTER — Other Ambulatory Visit: Payer: Self-pay

## 2023-04-10 DIAGNOSIS — Z1211 Encounter for screening for malignant neoplasm of colon: Secondary | ICD-10-CM | POA: Diagnosis not present

## 2023-05-13 ENCOUNTER — Other Ambulatory Visit: Payer: Self-pay

## 2023-06-09 ENCOUNTER — Other Ambulatory Visit: Payer: Self-pay

## 2023-06-10 ENCOUNTER — Other Ambulatory Visit: Payer: Self-pay

## 2023-06-10 MED ORDER — METFORMIN HCL ER 500 MG PO TB24
1000.0000 mg | ORAL_TABLET | Freq: Every day | ORAL | 11 refills | Status: DC
  Filled 2023-06-10: qty 60, 30d supply, fill #0
  Filled 2023-07-14: qty 60, 30d supply, fill #1
  Filled 2023-08-13: qty 60, 30d supply, fill #2
  Filled 2023-09-17: qty 60, 30d supply, fill #3
  Filled 2023-10-16: qty 60, 30d supply, fill #4
  Filled 2023-11-14: qty 60, 30d supply, fill #5
  Filled 2023-12-16: qty 60, 30d supply, fill #6
  Filled 2024-01-05 – 2024-01-09 (×2): qty 60, 30d supply, fill #7
  Filled 2024-02-09: qty 60, 30d supply, fill #8
  Filled 2024-03-14: qty 60, 30d supply, fill #9
  Filled 2024-04-21: qty 60, 30d supply, fill #10
  Filled 2024-05-20: qty 60, 30d supply, fill #11

## 2023-07-14 ENCOUNTER — Other Ambulatory Visit: Payer: Self-pay

## 2023-07-14 MED ORDER — PHENTERMINE HCL 37.5 MG PO TABS
37.5000 mg | ORAL_TABLET | Freq: Every day | ORAL | 3 refills | Status: DC
Start: 2023-07-14 — End: 2023-11-18
  Filled 2023-07-14: qty 30, 30d supply, fill #0
  Filled 2023-11-14: qty 30, 30d supply, fill #1

## 2023-07-19 NOTE — Patient Instructions (Incomplete)

## 2023-07-31 ENCOUNTER — Ambulatory Visit: Payer: Commercial Managed Care - PPO | Admitting: Nurse Practitioner

## 2023-08-06 ENCOUNTER — Other Ambulatory Visit: Payer: Self-pay

## 2023-08-06 DIAGNOSIS — H6993 Unspecified Eustachian tube disorder, bilateral: Secondary | ICD-10-CM | POA: Diagnosis not present

## 2023-08-06 DIAGNOSIS — H9313 Tinnitus, bilateral: Secondary | ICD-10-CM | POA: Diagnosis not present

## 2023-08-06 MED ORDER — PREDNISONE 20 MG PO TABS
20.0000 mg | ORAL_TABLET | Freq: Two times a day (BID) | ORAL | 0 refills | Status: DC
  Filled 2023-08-06: qty 10, 5d supply, fill #0

## 2023-08-06 MED ORDER — AMOXICILLIN-POT CLAVULANATE 875-125 MG PO TABS
1.0000 | ORAL_TABLET | Freq: Two times a day (BID) | ORAL | 0 refills | Status: DC
  Filled 2023-08-06: qty 14, 7d supply, fill #0

## 2023-08-06 MED ORDER — FLUTICASONE PROPIONATE 50 MCG/ACT NA SUSP
2.0000 | Freq: Every day | NASAL | 0 refills | Status: DC
  Filled 2023-08-06: qty 16, 30d supply, fill #0

## 2023-08-13 ENCOUNTER — Other Ambulatory Visit: Payer: Self-pay

## 2023-08-14 ENCOUNTER — Other Ambulatory Visit: Payer: Self-pay

## 2023-08-14 MED ORDER — LISINOPRIL 10 MG PO TABS
10.0000 mg | ORAL_TABLET | Freq: Every day | ORAL | 5 refills | Status: DC
  Filled 2023-08-14: qty 30, 30d supply, fill #0
  Filled 2023-09-17: qty 30, 30d supply, fill #1
  Filled 2023-10-16: qty 30, 30d supply, fill #2
  Filled 2023-11-14: qty 30, 30d supply, fill #3
  Filled 2023-12-16: qty 30, 30d supply, fill #4
  Filled 2024-01-05 – 2024-01-09 (×2): qty 30, 30d supply, fill #5

## 2023-08-14 MED ORDER — HYDROCHLOROTHIAZIDE 25 MG PO TABS
25.0000 mg | ORAL_TABLET | Freq: Every day | ORAL | 5 refills | Status: DC
  Filled 2023-08-14: qty 30, 30d supply, fill #0
  Filled 2023-09-17: qty 30, 30d supply, fill #1
  Filled 2023-10-16: qty 30, 30d supply, fill #2
  Filled 2023-11-14: qty 30, 30d supply, fill #3
  Filled 2023-12-16: qty 30, 30d supply, fill #4
  Filled 2024-01-05 – 2024-01-09 (×2): qty 30, 30d supply, fill #5

## 2023-09-15 ENCOUNTER — Other Ambulatory Visit: Payer: Self-pay

## 2023-09-15 DIAGNOSIS — M79642 Pain in left hand: Secondary | ICD-10-CM | POA: Diagnosis not present

## 2023-09-15 DIAGNOSIS — R200 Anesthesia of skin: Secondary | ICD-10-CM | POA: Diagnosis not present

## 2023-09-15 DIAGNOSIS — M79641 Pain in right hand: Secondary | ICD-10-CM | POA: Diagnosis not present

## 2023-09-15 DIAGNOSIS — R202 Paresthesia of skin: Secondary | ICD-10-CM | POA: Diagnosis not present

## 2023-09-15 DIAGNOSIS — G5603 Carpal tunnel syndrome, bilateral upper limbs: Secondary | ICD-10-CM | POA: Diagnosis not present

## 2023-09-15 MED ORDER — GABAPENTIN 300 MG PO CAPS
ORAL_CAPSULE | Freq: Three times a day (TID) | ORAL | 5 refills | Status: DC
  Filled 2023-09-15: qty 150, 30d supply, fill #0
  Filled 2023-10-16: qty 150, 30d supply, fill #1
  Filled 2023-11-14: qty 150, 30d supply, fill #2

## 2023-09-17 ENCOUNTER — Other Ambulatory Visit: Payer: Self-pay

## 2023-10-15 ENCOUNTER — Encounter: Payer: Self-pay | Admitting: Obstetrics and Gynecology

## 2023-10-15 ENCOUNTER — Inpatient Hospital Stay: Payer: Commercial Managed Care - PPO | Attending: Obstetrics and Gynecology | Admitting: Obstetrics and Gynecology

## 2023-10-15 VITALS — BP 110/69 | HR 78 | Temp 97.5°F | Resp 20 | Wt 356.0 lb

## 2023-10-15 DIAGNOSIS — Z8542 Personal history of malignant neoplasm of other parts of uterus: Secondary | ICD-10-CM | POA: Diagnosis not present

## 2023-10-15 DIAGNOSIS — Z90710 Acquired absence of both cervix and uterus: Secondary | ICD-10-CM | POA: Insufficient documentation

## 2023-10-15 DIAGNOSIS — C541 Malignant neoplasm of endometrium: Secondary | ICD-10-CM

## 2023-10-15 DIAGNOSIS — Z803 Family history of malignant neoplasm of breast: Secondary | ICD-10-CM | POA: Diagnosis not present

## 2023-10-15 DIAGNOSIS — Z90722 Acquired absence of ovaries, bilateral: Secondary | ICD-10-CM | POA: Diagnosis not present

## 2023-10-15 NOTE — Progress Notes (Signed)
 Gynecologic Oncology Interval Visit   Referring Provider: Dr. Feliberto Gottron  Chief Complaint: Endometrial Cancer, grade 1 Subjective:  Cassandra Sparks is a 53 y.o. G3P2 female who is seen in consultation from Dr. Feliberto Gottron for endometrial cancer.   No new complaints today. No vaginal bleeding, pain, or discharge. Bowel movements are regular, no constipation or diarrhea. No changes in urination.   Started GLP1 drug for obesity recently and has lost 15 pounds.    Gynecologic Oncology History  Cassandra Sparks is a pleasant G3P2 female with stage Ia, grade 1 who is seen in consultation from Dr. Feliberto Gottron for endometrial cancer. Her history is noted below.   Patient initially presented as referral from PCP/Dr. Mild for spotting over past several months. She saw Dr. Feliberto Gottron on 01/04/2019. Pap was performed but results not yet available. Endometrial biopsy was performed which revealed:   Diagnosis: Endometrium, Biopsy:  - well differentiated endometrioid carcinoma with mucinous differentiation (FIGO I)  On 01/20/2019 she underwent exam under anesthesia, robotic assisted laparoscopic hysterectomy, bilateral salpingo-oophorectomy; lysis of adhesions (30 minutes); sentinel node injection, mapping, and bilateral pelvic sentinel lymph node biopsy; and washings with Dr. Elesa Massed.   Final pathology:  Histologic Type: Endometrioid carcinoma, with mucinous differentiation  Histologic Grade: FIGO grade 1   Myometrial Invasion: present, MELF type       Depth of Myometrial Invasion (millimeters): 1 mm       Myometrial Thickness (millimeters): 13 mm       Percentage of Myometrial Invasion: 8%   Uterine Serosa Involvement: Not identified  Cervical Stromal Involvement: Not identified  Other Tissue/Organ Involvement: Not identified   MLH1: Intact nuclear expression  MSH2: Intact nuclear expression  MSH6: Intact nuclear expression  PMS2: Intact nuclear expression   Washings: negative  Problem  List: Patient Active Problem List   Diagnosis Date Noted   Morbid obesity with BMI of 40.0-44.9, adult (HCC) 02/06/2019   Carpal tunnel syndrome 01/13/2019   Endometrial cancer (HCC) 01/13/2019   Hyperthyroidism 06/16/2018   Weakness of lower extremity 05/07/2016    Past Medical History: Past Medical History:  Diagnosis Date   Anemia    Diabetes mellitus without complication (HCC)    Hypertension    Hyperthyroidism    Primary endometrioid carcinoma of endometrium of uterine body (HCC)    Thyroid disease     Past Surgical History: Past Surgical History:  Procedure Laterality Date   ABDOMINAL HYSTERECTOMY     BREAST BIOPSY Right 2013   neg   DILATION AND CURETTAGE, DIAGNOSTIC / THERAPEUTIC     ECTOPIC PREGNANCY SURGERY     ROBOTIC ASSISTED TOTAL HYSTERECTOMY WITH BILATERAL SALPINGO OOPHERECTOMY N/A 01/20/2019   Procedure: ROBOTIC ASSISTED TOTAL LAPAROSCOPIC HYSTERECTOMY WITH BILATERAL SALPINGO OOPHORECTOMY, PELVIC SENTINEL LYMPH NODE MAPPING, POSSIBLE PELVIC/AORTIC LYMPH NODE DISSECTION;  Surgeon: Ward, Elenora Fender, MD;  Location: [facility];  Service: Gynecology;  Laterality: N/A;    Past Gynecologic History:  Menarche: age 52 Periods last ~ 3 days Menses regular Post menopausal  Family History: Family History  Problem Relation Age of Onset   Diabetes Mother    Breast cancer Mother    Heart attack Father    Stroke Father    Thyroid disease Neg Hx      Social History: Social History   Socioeconomic History   Marital status: Single    Spouse name: Not on file   Number of children: Not on file   Years of education: Not on file   Highest education level: Not  on file  Occupational History   Not on file  Tobacco Use   Smoking status: Never   Smokeless tobacco: Never  Vaping Use   Vaping status: Never Used  Substance and Sexual Activity   Alcohol use: No    Alcohol/week: 0.0 standard drinks of alcohol   Drug use: Never   Sexual activity: Yes  Other Topics  Concern   Not on file  Social History Narrative   Not on file   Social Drivers of Health   Financial Resource Strain: Not on file  Food Insecurity: Not on file  Transportation Needs: Not on file  Physical Activity: Not on file  Stress: Not on file  Social Connections: Not on file  Intimate Partner Violence: Not on file    Allergies: Allergies  Allergen Reactions   Latex Hives and Itching   Other Swelling   Fish Allergy Rash    Current Medications: Current Outpatient Medications  Medication Sig Dispense Refill   amoxicillin-clavulanate (AUGMENTIN) 875-125 MG tablet Take 1 tablet by mouth 2 (two) times daily for 7 days 14 tablet 0   fluticasone (FLONASE) 50 MCG/ACT nasal spray Place 2 sprays into both nostrils daily. 16 g 0   gabapentin (NEURONTIN) 300 MG capsule Take 1 capsule (300 mg total) by mouth 2 (two) times daily. 60 capsule 8   gabapentin (NEURONTIN) 300 MG capsule Take 1 capsule (300 mg) by mouth in the morning and in the afternoon, AND 2 capsules (600 mg) nightly for 7 days, THEN take 1 capsule (300 mg) in the morning AND 2 capsules (600 mg) by mouth in the afternoon and nightly. 150 capsule 5   hydrochlorothiazide (HYDRODIURIL) 25 MG tablet Take 1 tablet (25 mg total) by mouth daily for high blood pressure. 30 tablet 5   lisinopril (ZESTRIL) 10 MG tablet Take 1 tablet (10 mg total) by mouth daily for high blood pressure. 30 tablet 5   metFORMIN (GLUCOPHAGE-XR) 500 MG 24 hr tablet Take 2 tablets by mouth at bedtime. 60 tablet 11   methimazole (TAPAZOLE) 10 MG tablet Take 1 tablet by mouth daily.     phentermine (ADIPEX-P) 37.5 MG tablet Take 1 tablet (37.5 mg total) by mouth daily before breakfast for weight loss. 30 tablet 3   predniSONE (DELTASONE) 20 MG tablet Take 1 tablet (20 mg total) by mouth 2 (two) times daily for 5 days. 10 tablet 0   No current facility-administered medications for this visit.    Review of Systems General: no complaints  HEENT: no  complaints  Lungs: no complaints  Cardiac: no complaints  GI: no complaints  GU: no complaints  Musculoskeletal: no complaints  Extremities: no complaints  Skin: no complaints  Neuro: no complaints  Endocrine: no complaints  Psych: no complaints     Objective:  Physical Examination:  BP 110/69 (BP Location: Left Arm, Patient Position: Sitting, Cuff Size: Large)   Pulse 78   Temp (!) 97.5 F (36.4 C) (Tympanic)   Resp 20   Wt (!) 356 lb (161.5 kg)   BMI 55.76 kg/m     ECOG Performance status: 0  GENERAL: Patient is a well appearing female in no acute distress, obese HEENT:  Atraumatic and normocephalic. PERRL, neck supple. NODES:  No cervical, supraclavicular, axillary, or inguinal lymphadenopathy palpated.  LUNGS:  Normal respiratory effort.  ABDOMEN:  Soft, nontender. Nondistended. No masses/ascites/hernia/or hepatomegaly.  EXTREMITIES:  No peripheral edema.   SKIN:  Clear with no obvious rashes or skin changes. No nail dyscrasia.  NEURO:  Nonfocal. Well oriented.  Appropriate affect.  Pelvic: EGBUS: no lesions Cervix: surgically absent Vagina: no lesions, no discharge or bleeding Uterus: surgically absent BME: no palpable masses Rectovaginal: deferred    Assessment:  Cassandra Sparks is a 53 y.o. female diagnosed with stage Ia grade 1 endometrioid endometrial cancer with 1 mm invasion (pMMR) s/p surgery in 7/20. Clinically NED.  Medical co-morbidities complicating care: hyperthyroidism on methimazole, Body mass index is 55.76 kg/m., HTN, early AODM on metformin and GLP1.  Plan:   Problem List Items Addressed This Visit       Genitourinary   Endometrial cancer (HCC) - Primary (Chronic)   We recommended she follow up with her PCP or Dr. Feliberto Gottron, since if has been 5 years since surgery and the risk of recurrence is negligible she does not need to come to the cancer center for follow up.   We can see her back should the need arise.    Follow up with her  PCP for her other health care issues and screening.    A total of 15 minutes were spent with the patient/family today; >50% was spent in education, counseling and coordination of care for endometrial cancer.    Leida Lauth, MD  CC:  Dr. Feliberto Gottron

## 2023-10-16 ENCOUNTER — Other Ambulatory Visit: Payer: Self-pay

## 2023-10-16 DIAGNOSIS — G5603 Carpal tunnel syndrome, bilateral upper limbs: Secondary | ICD-10-CM | POA: Diagnosis not present

## 2023-10-16 DIAGNOSIS — G5602 Carpal tunnel syndrome, left upper limb: Secondary | ICD-10-CM | POA: Diagnosis not present

## 2023-10-30 DIAGNOSIS — G5601 Carpal tunnel syndrome, right upper limb: Secondary | ICD-10-CM | POA: Diagnosis not present

## 2023-11-18 NOTE — Patient Instructions (Signed)
 Prediabetes: What to Know Prediabetes is when your blood sugar, also called glucose, is at a higher level than normal but not high enough for you to be diagnosed with type 2 diabetes (type 2 diabetes mellitus). Having prediabetes puts you at risk for getting type 2 diabetes. By making some healthy changes, you may be able to prevent or delay getting type 2 diabetes. This is important because type 2 diabetes can lead to serious problems. Some of these include: Heart disease. Stroke. Blindness. Kidney disease. Depression. Poor blood flow in the feet and legs. In very bad cases, this could lead to having a leg removed by surgery (amputation). What are the causes? The exact cause of prediabetes isn't known. It may result from insulin resistance. Insulin resistance happens when cells in the body don't respond properly to insulin that the body makes. This can cause too much sugar to build up in the blood. High blood sugar, also called hyperglycemia, can develop. What increases the risk? Having a family member with type 2 diabetes. Being older than 53 years of age. Having had a temporary form of diabetes during a pregnancy. This is called gestational diabetes. Having had polycystic ovary syndrome (PCOS). Being overweight or obese. Being inactive and not getting much exercise. Having a history of heart disease. This may include problems with cholesterol levels, high levels of blood fats, or high blood pressure. What are the signs or symptoms? You may have no symptoms. If you do have symptoms, they may include: Increased hunger. Increased thirst. Needing to pee more often. Changes in how you see, like blurry vision. Feeling tired. How is this diagnosed? Prediabetes can be diagnosed with blood tests that check your blood sugar. One or more of these tests may be done: A fasting blood glucose (FBG) test. You won't be allowed to eat (you will fast) for at least 8 hours before a blood sample is  taken. An A1C blood test, also called a hemoglobin A1C test. This test shows information about blood sugar levels over the past 2?3 months. An oral glucose tolerance test (OGTT). This test measures your blood sugar at two points in time: After you haven't eaten for a while. This is your baseline level. Two hours after you drink a beverage that has sugar in it. You may be diagnosed with prediabetes if: Your FBG is 100?125 mg/dL (1.6-1.0 mmol/L). Your A1C level is 5.7?6.4% (39-46 mmol/mol). Your OGTT result is 140?199 mg/dL (9.6-04 mmol/L). These blood tests may need to be done again to be sure of the diagnosis. How is this treated? Treatment may include making changes to your diet and lifestyle. These changes can help lower your blood sugar and keep you from getting type 2 diabetes. In some cases, medicine may be given to help lower your risk. Follow these instructions at home: Eating and drinking  Eat and drink as told. Follow a healthy meal plan. This includes eating lean proteins, whole grains, legumes, fresh fruits and vegetables, low-fat dairy products, and healthy fats. Meet with an expert in healthy eating called a dietitian. This person can help create a healthy eating plan that's right for you. Lifestyle Do moderate-intensity exercise. Do this for at least 30 minutes a day on 5 or more days each week, or as told by your health care provider. A mix of activities may be best. Good choices include brisk walking, swimming, biking, and weight lifting. Try to lose weight if your provider says it's OK. Losing 5-7% of your body weight can  help reverse insulin resistance. Do not drink alcohol if: Your provider tells you not to drink. You're pregnant, may be pregnant, or plan to become pregnant. If you drink alcohol: Limit how much you have to: 0-1 drink a day if you're female. 0-2 drinks a day if you're female. Know how much alcohol is in your drink. In the [place] one drink is one 12 oz  bottle of beer (355 mL), one 5 oz glass of wine (148 mL), or one 1 oz glass of hard liquor (44 mL). General instructions Take medicines only as told. You may be given medicines that help lower the risk of type 2 diabetes. Do not smoke, vape, or use nicotine or tobacco. Where to find more information American Diabetes Association: diabetes.org/about-diabetes/prediabetes Academy of Nutrition and Dietetics: eatright.org American Heart Association: Go to ThisJobs.cz. Click the search icon. Type "prediabetes" in the search box. Contact a health care provider if: You have any of these symptoms: Increased hunger. Peeing more often than usual. Increased thirst. Feeling tired. Changes in how you see, like blurry vision. Feeling like you may throw up. Throwing up. Get help right away if: You have shortness of breath. You feel confused. This information is not intended to replace advice given to you by your health care provider. Make sure you discuss any questions you have with your health care provider. Document Revised: 02/09/2023 Document Reviewed: 02/09/2023 Elsevier Patient Education  2024 [employer].

## 2023-11-25 ENCOUNTER — Encounter: Payer: Self-pay | Admitting: Nurse Practitioner

## 2023-11-25 ENCOUNTER — Ambulatory Visit: Payer: Self-pay | Admitting: Nurse Practitioner

## 2023-11-25 ENCOUNTER — Other Ambulatory Visit: Payer: Self-pay

## 2023-11-25 VITALS — BP 136/82 | HR 85 | Temp 98.6°F | Ht 65.8 in | Wt 339.0 lb

## 2023-11-25 DIAGNOSIS — Z7689 Persons encountering health services in other specified circumstances: Secondary | ICD-10-CM

## 2023-11-25 DIAGNOSIS — B351 Tinea unguium: Secondary | ICD-10-CM | POA: Insufficient documentation

## 2023-11-25 DIAGNOSIS — R7303 Prediabetes: Secondary | ICD-10-CM | POA: Diagnosis not present

## 2023-11-25 DIAGNOSIS — H9313 Tinnitus, bilateral: Secondary | ICD-10-CM | POA: Insufficient documentation

## 2023-11-25 DIAGNOSIS — Z8542 Personal history of malignant neoplasm of other parts of uterus: Secondary | ICD-10-CM | POA: Diagnosis not present

## 2023-11-25 DIAGNOSIS — E559 Vitamin D deficiency, unspecified: Secondary | ICD-10-CM | POA: Diagnosis not present

## 2023-11-25 DIAGNOSIS — I10 Essential (primary) hypertension: Secondary | ICD-10-CM | POA: Diagnosis not present

## 2023-11-25 DIAGNOSIS — Z6841 Body Mass Index (BMI) 40.0 and over, adult: Secondary | ICD-10-CM

## 2023-11-25 DIAGNOSIS — E538 Deficiency of other specified B group vitamins: Secondary | ICD-10-CM | POA: Diagnosis not present

## 2023-11-25 DIAGNOSIS — E0590 Thyrotoxicosis, unspecified without thyrotoxic crisis or storm: Secondary | ICD-10-CM | POA: Diagnosis not present

## 2023-11-25 LAB — MICROALBUMIN, URINE WAIVED
Creatinine, Urine Waived: 300 mg/dL (ref 10–300)
Microalb, Ur Waived: 80 mg/L — ABNORMAL HIGH (ref 0–19)

## 2023-11-25 LAB — BAYER DCA HB A1C WAIVED: HB A1C (BAYER DCA - WAIVED): 5.3 % (ref 4.8–5.6)

## 2023-11-25 MED ORDER — CICLOPIROX 8 % EX SOLN
Freq: Every day | CUTANEOUS | 1 refills | Status: DC
  Filled 2023-11-25: qty 6.6, 30d supply, fill #0

## 2023-11-25 NOTE — Progress Notes (Signed)
 New Patient Office Visit  Subjective    Patient ID: Cassandra Sparks, female    DOB: June 27, 1971  Age: 53 y.o. MRN: [id number]  CC:  Chief Complaint  Patient presents with   Diabetes   Thyroid  Problem    Patient states she has been told she has an issue with her thyroid  in the past. Unclear as to what it was, would like levels checked today.    Hypertension   Tinnitus    Patient states she has been having ringing in her ears. States she went to UC a few months ago and was told she had fluid in her ears. States the problem hasn't gotten any better since seeing them    Foot Problem    Patient states she thinks she may have a fungus on the bottom of both of her big toes. Would like recommendations on how to get rid of this.     HPI Cassandra Sparks presents for new patient visit to establish care.  Introduced to Publishing rights manager role and practice setting.  All questions answered.  Discussed provider/patient relationship and expectations.  Was being followed at [facility], she lives around the corner.  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  History of endometrial cancer, is clear now.  Has been 5 years.  Had full hysterectomy.  No chemo or radiation.  They did not report pap need.  Mother had breast cancer, she goes for mammos.  Has fungal disease to both great toes and a mole to top of right foot previous PCP was monitoring.  Impaired Fasting Glucose Taking Semaglutide  1 MG weekly, does have nausea with this and dry mouth.  Has Zofran  to take for nausea.  Taking Phentermine  and Metformin  as well. HbA1C: no recent level in chart -- has not had checked in a long while. Has had palpitations since starting Phentermine . Duration of elevated blood sugar: years Polydipsia: no Polyuria: no Weight change: no Visual disturbance: no Glucose Monitoring: no    Accucheck frequency: Not Checking    Fasting glucose:     Post prandial:  Diabetic Education: Not Completed Family history  of diabetes: yes -- mother and father  HYPERTENSION / HYPERLIPIDEMIA Takes Lisinopril  and HCTZ. Satisfied with current treatment? yes Duration of hypertension: chronic BP monitoring frequency: not checking BP range:  BP medication side effects: no Duration of hyperlipidemia: chronic Cholesterol supplements: none Aspirin: no Recent stressors: no Recurrent headaches: no Visual changes: no Palpitations: no Dyspnea: no Chest pain: no Lower extremity edema: no Dizzy/lightheaded: no   HYPERTHYROIDISM Noted on past labs and did go to endo in past in [place], they left and she has not had labs recently. Taking Gabapentin  for carpal tunnel both wrists, they have recommended surgery and she wants to hold off.  Does not recall having thyroid  ultrasound in past.  Father had thyroid  disease, unsure which one. Thyroid  control status:stable Fatigue: no Cold intolerance: no Heat intolerance: no Weight gain: no Weight loss: no Constipation: no Diarrhea/loose stools: no Palpitations: with Phentermine  has had these Lower extremity edema: no Anxiety/depressed mood: no   TINNITUS Has been going on for months, went to UC and was told she had fluid in the ears.  Was given Prednisone  at the time and symptoms improved, but then they returned. Uses Claritin for allergies.  In morning she does notice being stuffy and mild cough.  Current ringing has been present for two weeks, in right ear sometimes feels like things are moving. 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 Exposure to ototoxic medications: no Vertigo:no Hearing loss: no Aural fullness: no Headache:no  TMJ syndrome  symptoms: no Unsteady gait: no Postural instability: no Diplopia, dysarthria, dysphagia or weakness: no Anxietydepression: no      11/25/2023    3:51 PM 07/02/2016    1:42 PM  Depression screen PHQ 2/9  Decreased Interest 0 0  Down, Depressed, Hopeless 0 0  PHQ - 2 Score 0 0  Altered sleeping 0   Tired, decreased energy 0   Change in appetite 0   Feeling bad or failure about yourself  0   Trouble concentrating 0   Moving slowly or fidgety/restless 0   Suicidal thoughts 0   PHQ-9 Score 0   Difficult doing work/chores Not difficult at all        11/25/2023    3:51 PM  GAD 7 : Generalized Anxiety Score  Nervous, Anxious, on Edge 0  Control/stop worrying 0  Worry too much - different things 0  Trouble relaxing 0  Restless 0  Easily annoyed or irritable 0  Afraid - awful might happen 0  Total GAD 7 Score 0  Anxiety Difficulty Not difficult at all   Outpatient Encounter Medications as of 11/25/2023  Medication Sig   ciclopirox (PENLAC) 8 % solution Apply topically at bedtime. Apply over nail and surrounding skin. Apply daily over previous coat. After seven (7) days, may remove with alcohol and continue cycle.   gabapentin  (NEURONTIN ) 300 MG capsule Take 1 capsule (300 mg total) by mouth 2 (two) times daily. (Patient taking differently: 1 capsule in the AM, 2 capsules at lunch, and 2 capsule at bedtime)   hydrochlorothiazide  (HYDRODIURIL ) 25 MG tablet Take 1 tablet (25 mg total) by mouth daily for high blood pressure.   Insulin  Pen Needle 31G X 5 MM MISC As needed for injections   lisinopril  (ZESTRIL ) 10 MG tablet Take 1 tablet (10 mg total) by mouth daily for high blood pressure.   loratadine (CLARITIN) 10 MG tablet Take 10 mg by mouth daily.   metFORMIN  (GLUCOPHAGE -XR) 500 MG 24 hr tablet Take 2 tablets by mouth at bedtime.   Semaglutide -Weight Management 1 MG/0.5ML SOAJ Inject 1 mg into the skin once a week.   [DISCONTINUED] pantoprazole  (PROTONIX ) 40 MG tablet Take 40 mg by  mouth daily.   [DISCONTINUED] phentermine  37.5 MG capsule Take 37.5 mg by mouth every morning.   [DISCONTINUED] Semaglutide -Weight Management (WEGOVY ) 1.7 MG/0.75ML SOAJ Inject 1.7 mg into the skin once a week.   [DISCONTINUED] amoxicillin -clavulanate (AUGMENTIN ) 875-125 MG tablet Take 1 tablet by mouth 2 (two) times daily for 7 days (Patient not taking: Reported on 10/15/2023)   [DISCONTINUED] gabapentin  (NEURONTIN ) 300 MG capsule Take 1 capsule (300 mg) by mouth in the morning and in the afternoon, AND 2 capsules (600 mg) nightly for 7 days, THEN take 1 capsule (300 mg) in the morning AND 2 capsules (600 mg) by mouth in the afternoon and nightly. (Patient not taking: Reported on 10/15/2023)   [DISCONTINUED] methimazole  (TAPAZOLE ) 10 MG tablet Take 1 tablet by mouth daily. (Patient not taking: Reported on 10/15/2023)   [DISCONTINUED] phentermine  (ADIPEX-P ) 37.5 MG tablet Take 1 tablet (37.5 mg total) by mouth daily before breakfast for  weight loss. (Patient not taking: Reported on 10/15/2023)   [DISCONTINUED] predniSONE  (DELTASONE ) 20 MG tablet Take 1 tablet (20 mg total) by mouth 2 (two) times daily for 5 days. (Patient not taking: Reported on 10/15/2023)   No facility-administered encounter medications on file as of 11/25/2023.    Past Medical History:  Diagnosis Date   Anemia    Diabetes mellitus without complication (HCC)    Hypertension    Hyperthyroidism    Primary endometrioid carcinoma of endometrium of uterine body (HCC)    Thyroid  disease     Past Surgical History:  Procedure Laterality Date   ABDOMINAL HYSTERECTOMY     BREAST BIOPSY Right 2013   neg   DILATION AND CURETTAGE, DIAGNOSTIC / THERAPEUTIC     ECTOPIC PREGNANCY SURGERY     ROBOTIC ASSISTED TOTAL HYSTERECTOMY WITH BILATERAL SALPINGO OOPHERECTOMY N/A 01/20/2019   Procedure: ROBOTIC ASSISTED TOTAL LAPAROSCOPIC HYSTERECTOMY WITH BILATERAL SALPINGO OOPHORECTOMY, PELVIC SENTINEL LYMPH NODE MAPPING, POSSIBLE PELVIC/AORTIC LYMPH  NODE DISSECTION;  Surgeon: Ward, Margarie Shay, MD;  Location: [facility] ORS;  Service: Gynecology;  Laterality: N/A;    Family History  Problem Relation Age of Onset   Diabetes Mother    Breast cancer Mother    Heart attack Father    Stroke Father    Thyroid  disease Neg Hx     Social History   Socioeconomic History   Marital status: Single    Spouse name: Not on file   Number of children: Not on file   Years of education: Not on file   Highest education level: 12th grade  Occupational History   Not on file  Tobacco Use   Smoking status: Never   Smokeless tobacco: Never   Tobacco comments:    never smoked  Vaping Use   Vaping status: Never Used  Substance and Sexual Activity   Alcohol use: No   Drug use: Never   Sexual activity: Not Currently    Birth control/protection: None  Other Topics Concern   Not on file  Social History Narrative   Not on file   Social Drivers of Health   Financial Resource Strain: Low Risk  (11/20/2023)   Overall Financial Resource Strain (CARDIA)    Difficulty of Paying Living Expenses: Not hard at all  Food Insecurity: No Food Insecurity (11/20/2023)   Hunger Vital Sign    Worried About Running Out of Food in the Last Year: Never true    Ran Out of Food in the Last Year: Never true  Transportation Needs: No Transportation Needs (11/20/2023)   PRAPARE - Administrator, Civil Service (Medical): No    Lack of Transportation (Non-Medical): No  Physical Activity: Inactive (11/25/2023)   Exercise Vital Sign    Days of Exercise per Week: 0 days    Minutes of Exercise per Session: 0 min  Stress: No Stress Concern Present (11/20/2023)   [employer] of Occupational Health - Occupational Stress Questionnaire    Feeling of Stress : Not at all  Social Connections: Moderately Integrated (11/20/2023)   Social Connection and Isolation Panel [NHANES]    Frequency of Communication with Friends and Family: More than three times a week    Frequency of  Social Gatherings with Friends and Family: Twice a week    Attends Religious Services: More than 4 times per year    Active Member of [employer] or Organizations: Yes    Attends Banker Meetings: More than 4 times per year  Marital Status: Never married  Intimate Partner Violence: Not At Risk (11/25/2023)   Humiliation, Afraid, Rape, and Kick questionnaire    Fear of Current or Ex-Partner: No    Emotionally Abused: No    Physically Abused: No    Sexually Abused: No    Review of Systems  Constitutional:  Negative for chills, fever and malaise/fatigue.  HENT:  Positive for congestion (in morning on occasion) and tinnitus. Negative for ear discharge, ear pain and hearing loss.   Respiratory:  Negative for cough, sputum production, shortness of breath and wheezing.   Cardiovascular:  Positive for palpitations (with phentermine ). Negative for chest pain, orthopnea and leg swelling.  Gastrointestinal:  Positive for constipation (with semglutide at times.) and nausea (with Semaglutide  at times). Negative for blood in stool, diarrhea and vomiting.  Neurological: Negative.   Psychiatric/Behavioral: Negative.         Objective    BP 136/82   Pulse 85   Temp 98.6 F (37 C) (Oral)   Ht 5' 5.8" (1.671 m)   Wt (!) 339 lb (153.8 kg)   SpO2 96%   BMI 55.05 kg/m   Physical Exam Vitals and nursing note reviewed.  Constitutional:      General: She is awake. She is not in acute distress.    Appearance: She is well-developed and well-groomed. She is obese. She is not ill-appearing or toxic-appearing.  HENT:     Head: Normocephalic.     Right Ear: Hearing and external ear normal.     Left Ear: Hearing and external ear normal.  Eyes:     General: Lids are normal.        Right eye: No discharge.        Left eye: No discharge.     Conjunctiva/sclera: Conjunctivae normal.     Pupils: Pupils are equal, round, and reactive to light.  Neck:     Thyroid : No thyromegaly.     Vascular:  No carotid bruit.  Cardiovascular:     Rate and Rhythm: Normal rate and regular rhythm.     Pulses:          Dorsalis pedis pulses are 2+ on the right side and 2+ on the left side.       Posterior tibial pulses are 2+ on the right side and 2+ on the left side.     Heart sounds: Normal heart sounds. No murmur heard.    No gallop.  Pulmonary:     Effort: Pulmonary effort is normal. No accessory muscle usage or respiratory distress.     Breath sounds: Normal breath sounds.  Abdominal:     General: Bowel sounds are normal. There is no distension.     Palpations: Abdomen is soft.     Tenderness: There is no abdominal tenderness.  Musculoskeletal:     Cervical back: Normal range of motion and neck supple.     Right lower leg: No edema.     Left lower leg: No edema.     Right foot: Normal range of motion.     Left foot: Normal range of motion.  Feet:     Right foot:     Protective Sensation: 10 sites tested.  10 sites sensed.     Skin integrity: Skin integrity normal.     Toenail Condition: Fungal disease present.    Left foot:     Protective Sensation: 10 sites tested.  10 sites sensed.     Skin integrity: Skin integrity normal.  Toenail Condition: Fungal disease present.    Comments: Fungal disease present to both great toes and to 2nd and 4th toe right foot. Lymphadenopathy:     Cervical: No cervical adenopathy.  Skin:    General: Skin is warm and dry.  Neurological:     Mental Status: She is alert and oriented to person, place, and time.     Deep Tendon Reflexes: Reflexes are normal and symmetric.     Reflex Scores:      Brachioradialis reflexes are 2+ on the right side and 2+ on the left side.      Patellar reflexes are 2+ on the right side and 2+ on the left side. Psychiatric:        Attention and Perception: Attention normal.        Mood and Affect: Mood normal.        Speech: Speech normal.        Behavior: Behavior normal. Behavior is cooperative.        Thought  Content: Thought content normal.    Last CBC Lab Results  Component Value Date   WBC 5.9 01/18/2021   HGB 11.9 (L) 01/18/2021   HCT 36.8 01/18/2021   MCV 80.9 01/18/2021   MCH 26.2 01/18/2021   RDW 13.3 01/18/2021   PLT 282 01/18/2021   Last metabolic panel Lab Results  Component Value Date   GLUCOSE 96 01/18/2021   NA 140 01/18/2021   K 3.8 01/18/2021   CL 102 01/18/2021   CO2 29 01/18/2021   BUN 16 01/18/2021   CREATININE 0.54 01/18/2021   GFRNONAA >60 01/18/2021   CALCIUM 9.4 01/18/2021   PROT 7.0 01/18/2021   ALBUMIN 3.4 (L) 01/18/2021   BILITOT 0.5 01/18/2021   ALKPHOS 64 01/18/2021   AST 15 01/18/2021   ALT 14 01/18/2021   ANIONGAP 9 01/18/2021   Last thyroid  functions Lab Results  Component Value Date   TSH 1.431 01/14/2019   T3TOTAL 156 01/14/2019        Assessment & Plan:   Problem List Items Addressed This Visit       Cardiovascular and Mediastinum   Essential hypertension   Chronic, stable with BP close to goal today.  Continue Lisinopril  and HCTZ at current doses and adjust as needed.  Urine ALB 80 May 2025. Recommend she monitor BP at least a few mornings a week at home and document. DASH diet at home. Labs today: CBC, CMP, TSH, urine ALB and lipid panel.           Endocrine   Hyperthyroidism   Ongoing with improvement reported by patient on recent labs.  Will recheck thyroid  labs today and if low levels consider ultrasound + return to endo as needed.      Relevant Orders   TSH   Thyroid  peroxidase antibody   T4, free     Musculoskeletal and Integument   Onychomycosis   Ongoing issue for months.  Will trial Penlac. Educated her on this. Keep feet dry.      Relevant Medications   ciclopirox (PENLAC) 8 % solution     Other   Tinnitus, bilateral   Ongoing on and off for months. Suspect related to allergies based on exam.  Recommend to start using Flonase  daily and continue Claritin.  Will try to avoid prescribing Prednisone  again  due to prediabetes, but if ongoing over next 1-2 weeks will send 5 day supply in.  If ongoing issue then will get into ENT.  Prediabetes   Ongoing, A1c today 5.3%.  Stable level with Wegovy  and Metformin  on board.  Will continue these medications and adjust as needed.  She reports Wegovy  has been covered.  Monitor diet closely and recommend regular activity. Urine ALB 80 May 2025.      Relevant Orders   Bayer DCA Hb A1c Waived (Completed)   Microalbumin, Urine Waived (Completed)   CBC with Differential/Platelet   Comprehensive metabolic panel with GFR   Lipid Panel w/o Chol/HDL Ratio   Morbid obesity with BMI of 40.0-44.9, adult (HCC) - Primary   BMI 55.05, continues on Wegovy  and Metformin  which are offering benefit to prediabetes and OSA.  Will continue these medications, she reports they are covered. Recommended eating smaller high protein, low fat meals more frequently and exercising 30 mins a day 5 times a week with a goal of 10-15lb weight loss in the next 3 months. Patient voiced their understanding and motivation to adhere to these recommendations.       Relevant Medications   Semaglutide -Weight Management 1 MG/0.5ML SOAJ   History of endometrial cancer   Continue to collaborate with GYN as needed.      Other Visit Diagnoses       Vitamin D deficiency       History of low levels reported, check today and start supplement as needed.   Relevant Orders   VITAMIN D 25 Hydroxy (Vit-D Deficiency, Fractures)     Vitamin B12 deficiency       History of low levels reported, check today and start supplement as needed.   Relevant Orders   CBC with Differential/Platelet   Vitamin B12     Encounter to establish care       Introduced to clinic setting and provider.       Return in about 4 weeks (around 12/23/2023) for Tinnitus.   Prisha Hiley T Diona Peregoy, NP

## 2023-11-25 NOTE — Assessment & Plan Note (Signed)
 Ongoing with improvement reported by patient on recent labs.  Will recheck thyroid  labs today and if low levels consider ultrasound + return to endo as needed.

## 2023-11-25 NOTE — Assessment & Plan Note (Signed)
 Ongoing on and off for months. Suspect related to allergies based on exam.  Recommend to start using Flonase  daily and continue Claritin.  Will try to avoid prescribing Prednisone  again due to prediabetes, but if ongoing over next 1-2 weeks will send 5 day supply in.  If ongoing issue then will get into ENT.

## 2023-11-25 NOTE — Assessment & Plan Note (Signed)
 Continue to collaborate with GYN as needed.

## 2023-11-25 NOTE — Progress Notes (Signed)
 Contacted via MyChart   Good afternoon Cassandra Sparks, nice to meet you today and look forward to working as a team.  Your A1c is below prediabetic level at 5.3%.  Great news.  Urine is showing some protein at 80, we will continue Lisinopril  which can help with this:)

## 2023-11-25 NOTE — Assessment & Plan Note (Signed)
 Ongoing issue for months.  Will trial Penlac. Educated her on this. Keep feet dry.

## 2023-11-25 NOTE — Assessment & Plan Note (Addendum)
 BMI 55.05, continues on Wegovy  and Metformin  which are offering benefit to prediabetes and OSA.  Will continue these medications, she reports they are covered. Recommended eating smaller high protein, low fat meals more frequently and exercising 30 mins a day 5 times a week with a goal of 10-15lb weight loss in the next 3 months. Patient voiced their understanding and motivation to adhere to these recommendations.

## 2023-11-25 NOTE — Assessment & Plan Note (Addendum)
 Chronic, stable with BP close to goal today.  Continue Lisinopril  and HCTZ at current doses and adjust as needed.  Urine ALB 80 May 2025. Recommend she monitor BP at least a few mornings a week at home and document. DASH diet at home. Labs today: CBC, CMP, TSH, urine ALB and lipid panel.

## 2023-11-25 NOTE — Assessment & Plan Note (Addendum)
 Ongoing, A1c today 5.3%.  Stable level with Wegovy  and Metformin  on board.  Will continue these medications and adjust as needed.  She reports Wegovy  has been covered.  Monitor diet closely and recommend regular activity. Urine ALB 80 May 2025.

## 2023-11-26 ENCOUNTER — Other Ambulatory Visit: Payer: Self-pay | Admitting: Nurse Practitioner

## 2023-11-26 DIAGNOSIS — Z1231 Encounter for screening mammogram for malignant neoplasm of breast: Secondary | ICD-10-CM

## 2023-11-26 NOTE — Progress Notes (Signed)
 Contacted via MyChart The 10-year ASCVD risk score (Arnett DK, et al., 2019) is: 9.7%   Values used to calculate the score:     Age: 53 years     Sex: Female     Is Non-Hispanic African American: Yes     Diabetic: Yes     Tobacco smoker: No     Systolic Blood Pressure: 136 mmHg     Is BP treated: Yes     HDL Cholesterol: 61 mg/dL     Total Cholesterol: 187 mg/dL   Good evening Cassandra Sparks, your labs have returned: - CBC shows no anemia or infection. - Vitamin B12 and D are normal. - Kidney function, creatinine and eGFR, remains normal, as is liver function, AST and ALT.  - Lipid panel shows some elevation in LDL, bad cholesterol, we will discuss this more next visit. - Thyroid  labs show elevation in TSH this time, meaning more sluggish thyroid  and hypo, Free T4 is normal.  Thyroid  antibody is elevated, which often points towards Hashimoto's thyroiditis.  When you body does not recognize thyroid  and attacks it, which causes inflammation.  We will recheck at next visit in June and start medication as needed.  I would recommend thyroid  ultrasound though as we discussed to see if any enlargement or nodules.  Would you like to pursue this?  Let me know.  Any questions? Keep being amazing!!  Thank you for allowing me to participate in your care.  I appreciate you. Kindest regards, Sarit Sparano

## 2023-11-27 ENCOUNTER — Encounter: Payer: Self-pay | Admitting: Nurse Practitioner

## 2023-11-27 LAB — CBC WITH DIFFERENTIAL/PLATELET
Basophils Absolute: 0 10*3/uL (ref 0.0–0.2)
Basos: 1 %
EOS (ABSOLUTE): 0.1 10*3/uL (ref 0.0–0.4)
Eos: 3 %
Hematocrit: 40.7 % (ref 34.0–46.6)
Hemoglobin: 13 g/dL (ref 11.1–15.9)
Immature Grans (Abs): 0 10*3/uL (ref 0.0–0.1)
Immature Granulocytes: 0 %
Lymphocytes Absolute: 1.4 10*3/uL (ref 0.7–3.1)
Lymphs: 30 %
MCH: 25.6 pg — ABNORMAL LOW (ref 26.6–33.0)
MCHC: 31.9 g/dL (ref 31.5–35.7)
MCV: 80 fL (ref 79–97)
Monocytes Absolute: 0.4 10*3/uL (ref 0.1–0.9)
Monocytes: 8 %
Neutrophils Absolute: 2.6 10*3/uL (ref 1.4–7.0)
Neutrophils: 58 %
Platelets: 355 10*3/uL (ref 150–450)
RBC: 5.07 x10E6/uL (ref 3.77–5.28)
RDW: 14 % (ref 11.7–15.4)
WBC: 4.5 10*3/uL (ref 3.4–10.8)

## 2023-11-27 LAB — COMPREHENSIVE METABOLIC PANEL WITH GFR
ALT: 14 IU/L (ref 0–32)
AST: 17 IU/L (ref 0–40)
Albumin: 4.3 g/dL (ref 3.8–4.9)
Alkaline Phosphatase: 83 IU/L (ref 44–121)
BUN/Creatinine Ratio: 11 (ref 9–23)
BUN: 9 mg/dL (ref 6–24)
Bilirubin Total: 0.3 mg/dL (ref 0.0–1.2)
CO2: 28 mmol/L (ref 20–29)
Calcium: 9.3 mg/dL (ref 8.7–10.2)
Chloride: 97 mmol/L (ref 96–106)
Creatinine, Ser: 0.8 mg/dL (ref 0.57–1.00)
Globulin, Total: 2.7 g/dL (ref 1.5–4.5)
Glucose: 80 mg/dL (ref 70–99)
Potassium: 3.8 mmol/L (ref 3.5–5.2)
Sodium: 140 mmol/L (ref 134–144)
Total Protein: 7 g/dL (ref 6.0–8.5)
eGFR: 89 mL/min/{1.73_m2} (ref >59)

## 2023-11-27 LAB — LIPID PANEL W/O CHOL/HDL RATIO
Cholesterol, Total: 187 mg/dL (ref 100–199)
HDL: 61 mg/dL (ref >39)
LDL Chol Calc (NIH): 112 mg/dL — ABNORMAL HIGH (ref 0–99)
Triglycerides: 79 mg/dL (ref 0–149)
VLDL Cholesterol Cal: 14 mg/dL (ref 5–40)

## 2023-11-27 LAB — TSH: TSH: 6.91 u[IU]/mL — ABNORMAL HIGH (ref 0.450–4.500)

## 2023-11-27 LAB — VITAMIN B12: Vitamin B-12: 1138 pg/mL (ref 232–1245)

## 2023-11-27 LAB — T4, FREE: Free T4: 1.05 ng/dL (ref 0.82–1.77)

## 2023-11-27 LAB — VITAMIN D 25 HYDROXY (VIT D DEFICIENCY, FRACTURES): Vit D, 25-Hydroxy: 34.4 ng/mL (ref 30.0–100.0)

## 2023-11-27 LAB — THYROID PEROXIDASE ANTIBODY: Thyroperoxidase Ab SerPl-aCnc: 249 [IU]/mL — ABNORMAL HIGH (ref 0–34)

## 2023-11-28 ENCOUNTER — Encounter (HOSPITAL_COMMUNITY): Payer: Self-pay

## 2023-12-05 ENCOUNTER — Ambulatory Visit
Admission: RE | Admit: 2023-12-05 | Discharge: 2023-12-05 | Disposition: A | Discharge status: Home / Self Care | Source: Ambulatory Visit | Attending: Nurse Practitioner | Admitting: Nurse Practitioner

## 2023-12-05 DIAGNOSIS — Z1231 Encounter for screening mammogram for malignant neoplasm of breast: Secondary | ICD-10-CM | POA: Insufficient documentation

## 2023-12-16 ENCOUNTER — Other Ambulatory Visit: Payer: Self-pay

## 2023-12-17 ENCOUNTER — Other Ambulatory Visit: Payer: Self-pay

## 2023-12-17 MED ORDER — GABAPENTIN 300 MG PO CAPS
ORAL_CAPSULE | ORAL | 1 refills | Status: DC
  Filled 2023-12-17 – 2024-01-05 (×2): qty 150, 30d supply, fill #0
  Filled 2024-02-09: qty 150, 30d supply, fill #1

## 2023-12-20 NOTE — Patient Instructions (Signed)
 Tinnitus Tinnitus is when you hear a sound that there's no actual source for. It may sound like ringing in your ears or something else. The sound may be loud, soft, or somewhere in between. It can last for a few seconds or be constant for days. It can come and go. Almost everyone has tinnitus at some point. It's not the same as hearing loss. But you may need to see a health care provider if: It lasts for a long time. It comes back often. You have trouble sleeping and focusing. What are the causes? The cause of tinnitus is often unknown. In some cases, you may get it if: You're around loud noises, such as from machines or music. An object gets stuck in your ear. Earwax builds up in Landscape architect. You drink a lot of alcohol or caffeine. You take certain medicines. You start to lose your hearing. You may also get it from some medical conditions. These may include: Ear or sinus infections. Heart diseases. High blood pressure. Allergies. Mnire's disease. Problems with your thyroid. A tumor. This is a growth of cells that isn't normal. A weak, bulging blood vessel called an aneurysm near your ear. What increases the risk? You may be more likely to get tinnitus if: You're around loud noises a lot. You're older. You drink alcohol. You smoke. What are the signs or symptoms? The main symptom is hearing a sound that there's no source for. It may sound like ringing. It may also sound like: Buzzing. Sizzling. Blowing air. Hissing. Whistling. Other sounds may include: Roaring. Running water. A musical note. Tapping. Humming. You may have symptoms in one ear or both ears. How is this diagnosed? Tinnitus is diagnosed based on your symptoms, your medical history, and an exam. Your provider may do a full hearing test if your tinnitus: Is in just one ear. Makes it hard for you to hear. Lasts 6 months or longer. You may also need to see an expert in hearing disorders called an audiologist.  They may ask you about your symptoms and how tinnitus affects your daily life. You may have other tests done. These may include: A CT scan. An MRI. An angiogram. This shows how blood flows through your blood vessels. How is this treated? Treatment may include: Therapy to help you manage the stress of living with tinnitus. Finding ways to mask or cover the sound of tinnitus. These include: Sound or white noise machines. Devices that fit in your ear and play sounds or music. A loud humidifier. Acoustic neural stimulation. This is when you use headphones to listen to music that has a special signal in it. Over time, this signal may change some of the pathways in your brain. This can make you less sensitive to tinnitus. This treatment is used for very severe cases. Using hearing aids or cochlear implants if your tinnitus is from hearing loss. If your tinnitus is caused by a medical condition, treating the condition may make it go away.  Follow these instructions at home: Managing symptoms     Try to avoid being in loud places or around loud noises. Wear earplugs or headphones when you're around loud noises. Find ways to reduce stress. These may include meditation, yoga, or deep breathing. Sleep with your head slightly raised. General instructions Take over-the-counter and prescription medicines only as told by your provider. Track the things that cause symptoms (triggers). Try to avoid these things. To stop your tinnitus from getting worse: Do not drink alcohol. Do  not have caffeine. Do not use any products that contain nicotine or tobacco. These products include cigarettes, chewing tobacco, and vaping devices, such as e-cigarettes. If you need help quitting, ask your provider. Avoid using too much salt. Get enough sleep each night. Where to find more information American Tinnitus Association: https://www.johnson-hamilton.org/ Contact a health care provider if: Your symptoms last for 3 weeks or longer without  stopping. You have sudden hearing loss. Your symptoms get worse or don't get better with home care. You can't manage the stress of living with tinnitus. Get help right away if: You get tinnitus after a head injury. You have tinnitus and: Dizziness. Nausea and vomiting. Loss of balance. A sudden, severe headache. Changes to your eyesight. Weakness in your face, arms, or legs. These symptoms may be an emergency. Get help right away. Call 911. Do not wait to see if the symptoms will go away. Do not drive yourself to the hospital. This information is not intended to replace advice given to you by your health care provider. Make sure you discuss any questions you have with your health care provider. Document Revised: 10/14/2022 Document Reviewed: 10/14/2022 Elsevier Patient Education  2024 [employer].

## 2023-12-24 ENCOUNTER — Encounter: Payer: Self-pay | Admitting: Nurse Practitioner

## 2023-12-24 ENCOUNTER — Ambulatory Visit: Admitting: Nurse Practitioner

## 2023-12-24 VITALS — BP 127/77 | HR 77 | Temp 98.1°F | Ht 65.8 in | Wt 336.8 lb

## 2023-12-24 DIAGNOSIS — H9313 Tinnitus, bilateral: Secondary | ICD-10-CM

## 2023-12-24 DIAGNOSIS — E063 Autoimmune thyroiditis: Secondary | ICD-10-CM | POA: Diagnosis not present

## 2023-12-24 DIAGNOSIS — R7303 Prediabetes: Secondary | ICD-10-CM

## 2023-12-24 DIAGNOSIS — Z1159 Encounter for screening for other viral diseases: Secondary | ICD-10-CM | POA: Diagnosis not present

## 2023-12-24 DIAGNOSIS — Z6841 Body Mass Index (BMI) 40.0 and over, adult: Secondary | ICD-10-CM | POA: Diagnosis not present

## 2023-12-24 DIAGNOSIS — Z114 Encounter for screening for human immunodeficiency virus [HIV]: Secondary | ICD-10-CM

## 2023-12-24 NOTE — Progress Notes (Signed)
 BP 127/77   Pulse 77   Temp 98.1 F (36.7 C) (Oral)   Ht 5' 5.8" (1.671 m)   Wt (!) 336 lb 12.8 oz (152.8 kg)   SpO2 98%   BMI 54.69 kg/m    Subjective:    Patient ID: Cassandra Sparks, female    DOB: 06-Jan-1971, 53 y.o.   MRN: [id number]  HPI: Cassandra Sparks is a 53 y.o. female  Chief Complaint  Patient presents with   Tinnitus    Patient states that the ringing in her ears has gotten better.   TINNITUS Reports this is improving, but still there. Coming and going.  Has been present for months and saw UC in past who provided her Prednisone .  Bilateral, R>L.  Tried Flonase  and Claritin last visit, Flonase  has done 2-3 times. There is some improvement with these. Duration: months Description of tinnitus: hissing Pulsatile: yes Tinnitus duration: a couple hours Episode frequency: recurrent Severity: moderate Aggravating factors: nothing Alleviating factors: nothing Head injury: no Chronic exposure to loud noises: no Exposure to ototoxic medications: no Vertigo:no Hearing loss: no Aural fullness: no Headache:no  TMJ syndrome symptoms: no Unsteady gait: no Postural instability: no Diplopia, dysarthria, dysphagia or weakness: no Anxietydepression: no   HYPOTHYROIDISM Recent TSH labs showed elevation in TSH and elevation in thyroid  antibody Etiology of hypothyroidism: Hashimoto's Fatigue: works two jobs Cold intolerance: no Heat intolerance: no Weight gain: no Weight loss: no Constipation: no Diarrhea/loose stools: no Palpitations: no Lower extremity edema: no Anxiety/depressed mood: no   Relevant past medical, surgical, family and social history reviewed and updated as indicated. Interim medical history since our last visit reviewed. Allergies and medications reviewed and updated.  Review of Systems  Constitutional:  Negative for activity change, appetite change, diaphoresis, fatigue and fever.  HENT:  Positive for tinnitus.   Respiratory:  Negative for  cough, chest tightness and shortness of breath.   Cardiovascular:  Negative for chest pain, palpitations and leg swelling.  Gastrointestinal: Negative.   Neurological: Negative.   Psychiatric/Behavioral: Negative.      Per HPI unless specifically indicated above     Objective:     BP 127/77   Pulse 77   Temp 98.1 F (36.7 C) (Oral)   Ht 5' 5.8" (1.671 m)   Wt (!) 336 lb 12.8 oz (152.8 kg)   SpO2 98%   BMI 54.69 kg/m   Wt Readings from Last 3 Encounters:  12/24/23 (!) 336 lb 12.8 oz (152.8 kg)  11/25/23 (!) 339 lb (153.8 kg)  10/15/23 (!) 356 lb (161.5 kg)    Physical Exam Vitals and nursing note reviewed.  Constitutional:      General: She is awake. She is not in acute distress.    Appearance: She is well-developed and well-groomed. She is obese. She is not ill-appearing or toxic-appearing.  HENT:     Head: Normocephalic.     Right Ear: Hearing and external ear normal.     Left Ear: Hearing and external ear normal.  Eyes:     General: Lids are normal.        Right eye: No discharge.        Left eye: No discharge.     Conjunctiva/sclera: Conjunctivae normal.     Pupils: Pupils are equal, round, and reactive to light.  Neck:     Thyroid : No thyromegaly.     Vascular: No carotid bruit.  Cardiovascular:     Rate and Rhythm: Normal rate and regular rhythm.  Heart sounds: Normal heart sounds. No murmur heard.    No gallop.  Pulmonary:     Effort: Pulmonary effort is normal. No accessory muscle usage or respiratory distress.     Breath sounds: Normal breath sounds.  Abdominal:     General: Bowel sounds are normal. There is no distension.     Palpations: Abdomen is soft.     Tenderness: There is no abdominal tenderness.  Musculoskeletal:     Cervical back: Normal range of motion and neck supple.     Right lower leg: No edema.     Left lower leg: No edema.  Lymphadenopathy:     Cervical: No cervical adenopathy.  Skin:    General: Skin is warm and dry.   Neurological:     Mental Status: She is alert and oriented to person, place, and time.     Deep Tendon Reflexes: Reflexes are normal and symmetric.     Reflex Scores:      Brachioradialis reflexes are 2+ on the right side and 2+ on the left side.      Patellar reflexes are 2+ on the right side and 2+ on the left side. Psychiatric:        Attention and Perception: Attention normal.        Mood and Affect: Mood normal.        Speech: Speech normal.        Behavior: Behavior normal. Behavior is cooperative.        Thought Content: Thought content normal.    Results for orders placed or performed in visit on 11/25/23  Bayer DCA Hb A1c Waived   Collection Time: 11/25/23 11:30 AM  Result Value Ref Range   HB A1C (BAYER DCA - WAIVED) 5.3 4.8 - 5.6 %  Microalbumin, Urine Waived   Collection Time: 11/25/23 11:30 AM  Result Value Ref Range   Microalb, Ur Waived 80 (H) 0 - 19 mg/L   Creatinine, Urine Waived 300 10 - 300 mg/dL   Microalb/Creat Ratio 30-300 (H) <30 mg/g  CBC with Differential/Platelet   Collection Time: 11/25/23 11:31 AM  Result Value Ref Range   WBC 4.5 3.4 - 10.8 x10E3/uL   RBC 5.07 3.77 - 5.28 x10E6/uL   Hemoglobin 13.0 11.1 - 15.9 g/dL   Hematocrit 16.1 09.6 - 46.6 %   MCV 80 79 - 97 fL   MCH 25.6 (L) 26.6 - 33.0 pg   MCHC 31.9 31.5 - 35.7 g/dL   RDW 04.5 40.9 - 81.1 %   Platelets 355 150 - 450 x10E3/uL   Neutrophils 58 Not Estab. %   Lymphs 30 Not Estab. %   Monocytes 8 Not Estab. %   Eos 3 Not Estab. %   Basos 1 Not Estab. %   Neutrophils Absolute 2.6 1.4 - 7.0 x10E3/uL   Lymphocytes Absolute 1.4 0.7 - 3.1 x10E3/uL   Monocytes Absolute 0.4 0.1 - 0.9 x10E3/uL   EOS (ABSOLUTE) 0.1 0.0 - 0.4 x10E3/uL   Basophils Absolute 0.0 0.0 - 0.2 x10E3/uL   Immature Granulocytes 0 Not Estab. %   Immature Grans (Abs) 0.0 0.0 - 0.1 x10E3/uL  Comprehensive metabolic panel with GFR   Collection Time: 11/25/23 11:31 AM  Result Value Ref Range   Glucose 80 70 - 99 mg/dL    BUN 9 6 - 24 mg/dL   Creatinine, Ser 9.14 0.57 - 1.00 mg/dL   eGFR 89 >78 GN/FAO/1.30   BUN/Creatinine Ratio 11 9 - 23   Sodium 140  134 - 144 mmol/L   Potassium 3.8 3.5 - 5.2 mmol/L   Chloride 97 96 - 106 mmol/L   CO2 28 20 - 29 mmol/L   Calcium 9.3 8.7 - 10.2 mg/dL   Total Protein 7.0 6.0 - 8.5 g/dL   Albumin 4.3 3.8 - 4.9 g/dL   Globulin, Total 2.7 1.5 - 4.5 g/dL   Bilirubin Total 0.3 0.0 - 1.2 mg/dL   Alkaline Phosphatase 83 44 - 121 IU/L   AST 17 0 - 40 IU/L   ALT 14 0 - 32 IU/L  TSH   Collection Time: 11/25/23 11:31 AM  Result Value Ref Range   TSH 6.910 (H) 0.450 - 4.500 uIU/mL  Lipid Panel w/o Chol/HDL Ratio   Collection Time: 11/25/23 11:31 AM  Result Value Ref Range   Cholesterol, Total 187 100 - 199 mg/dL   Triglycerides 79 0 - 149 mg/dL   HDL 61 >16 mg/dL   VLDL Cholesterol Cal 14 5 - 40 mg/dL   LDL Chol Calc (NIH) 109 (H) 0 - 99 mg/dL  Thyroid  peroxidase antibody   Collection Time: 11/25/23 11:31 AM  Result Value Ref Range   Thyroperoxidase Ab SerPl-aCnc 249 (H) 0 - 34 IU/mL  T4, free   Collection Time: 11/25/23 11:31 AM  Result Value Ref Range   Free T4 1.05 0.82 - 1.77 ng/dL  Vitamin B12   Collection Time: 11/25/23 11:31 AM  Result Value Ref Range   Vitamin B-12 1,138 232 - 1,245 pg/mL  VITAMIN D  25 Hydroxy (Vit-D Deficiency, Fractures)   Collection Time: 11/25/23 11:31 AM  Result Value Ref Range   Vit D, 25-Hydroxy 34.4 30.0 - 100.0 ng/mL      Assessment & Plan:   Problem List Items Addressed This Visit       Endocrine   Hypothyroidism due to Hashimoto thyroiditis   History of low TSH, but recent TSH elevated and antibody elevated.  Discussed with patient.  She is currently not having symptoms.  Will recheck labs outpatient and if ongoing elevation in TSH consider starting Levothyroxine.      Relevant Orders   T4, free   TSH     Other   Tinnitus, bilateral   Ongoing on and off for months, with some improvement.  Continue Flonase  daily and  continue Claritin.  Will try to avoid prescribing Prednisone  again due to prediabetes, but if ongoing at next visit will send 5 day supply in.  If ongoing issue then will get into ENT.  At this time she wishes to continue allergy medications.      Morbid obesity with BMI of 40.0-44.9, adult (HCC) - Primary   BMI 55.05, continues on Wegovy  and Metformin  which are offering benefit to prediabetes and OSA.  Will continue these medications, she reports they are covered. Recommended eating smaller high protein, low fat meals more frequently and exercising 30 mins a day 5 times a week with a goal of 10-15lb weight loss in the next 3 months. Patient voiced their understanding and motivation to adhere to these recommendations.       Other Visit Diagnoses       Encounter for screening for HIV       HIV screening today per guidelines, educated patient.   Relevant Orders   HIV Antibody (routine testing w rflx)     Need for hepatitis C screening test       Hep C screening today per guidelines, educated patient.   Relevant Orders   Hepatitis  C antibody        Follow up plan: Return in about 3 months (around 03/25/2024) for THYROID , IFG, HTN.

## 2023-12-24 NOTE — Assessment & Plan Note (Signed)
 Ongoing on and off for months, with some improvement.  Continue Flonase  daily and continue Claritin.  Will try to avoid prescribing Prednisone  again due to prediabetes, but if ongoing at next visit will send 5 day supply in.  If ongoing issue then will get into ENT.  At this time she wishes to continue allergy medications.

## 2023-12-24 NOTE — Assessment & Plan Note (Signed)
 BMI 55.05, continues on Wegovy  and Metformin  which are offering benefit to prediabetes and OSA.  Will continue these medications, she reports they are covered. Recommended eating smaller high protein, low fat meals more frequently and exercising 30 mins a day 5 times a week with a goal of 10-15lb weight loss in the next 3 months. Patient voiced their understanding and motivation to adhere to these recommendations.

## 2023-12-24 NOTE — Assessment & Plan Note (Signed)
 History of low TSH, but recent TSH elevated and antibody elevated.  Discussed with patient.  She is currently not having symptoms.  Will recheck labs outpatient and if ongoing elevation in TSH consider starting Levothyroxine.

## 2023-12-25 ENCOUNTER — Other Ambulatory Visit
Admission: RE | Admit: 2023-12-25 | Discharge: 2023-12-25 | Disposition: A | Discharge status: Home / Self Care | Attending: Nurse Practitioner | Admitting: Nurse Practitioner

## 2023-12-25 DIAGNOSIS — Z114 Encounter for screening for human immunodeficiency virus [HIV]: Secondary | ICD-10-CM | POA: Insufficient documentation

## 2023-12-25 DIAGNOSIS — E063 Autoimmune thyroiditis: Secondary | ICD-10-CM | POA: Insufficient documentation

## 2023-12-25 DIAGNOSIS — Z1159 Encounter for screening for other viral diseases: Secondary | ICD-10-CM | POA: Insufficient documentation

## 2023-12-25 LAB — TSH: TSH: 4.85 u[IU]/mL — ABNORMAL HIGH (ref 0.350–4.500)

## 2023-12-25 LAB — HEPATITIS C ANTIBODY: HCV Ab: NONREACTIVE

## 2023-12-25 LAB — HIV ANTIBODY (ROUTINE TESTING W REFLEX): HIV Screen 4th Generation wRfx: NONREACTIVE

## 2023-12-25 LAB — T4, FREE: Free T4: 0.97 ng/dL (ref 0.61–1.12)

## 2023-12-26 ENCOUNTER — Ambulatory Visit: Payer: Self-pay | Admitting: Nurse Practitioner

## 2023-12-26 DIAGNOSIS — E063 Autoimmune thyroiditis: Secondary | ICD-10-CM

## 2023-12-26 MED ORDER — LEVOTHYROXINE SODIUM 25 MCG PO TABS
25.0000 ug | ORAL_TABLET | Freq: Every day | ORAL | 3 refills | Status: DC
  Filled 2023-12-26: qty 60, 60d supply, fill #0
  Filled 2024-03-04: qty 60, 60d supply, fill #1
  Filled 2024-03-14: qty 60, 60d supply, fill #2

## 2023-12-26 NOTE — Progress Notes (Signed)
 Contacted via MyChart -- needs 6 week lab only visit please   Good evening Cassandra Sparks, your labs have returned.  HIV and Hep C are negative.  Thyroid  levels show elevation in TSH still but this is trending down and Free T4 is trending down.  Lets try starting a low dose of Levothyroxine and recheck levels in 6 weeks.  I will send this in and if any issues let me know. Any questions? Keep being stellar!!  Thank you for allowing me to participate in your care.  I appreciate you. Kindest regards, Gunnard Dorrance

## 2023-12-28 ENCOUNTER — Other Ambulatory Visit: Payer: Self-pay

## 2023-12-29 ENCOUNTER — Other Ambulatory Visit: Payer: Self-pay

## 2023-12-29 NOTE — Progress Notes (Signed)
 Called patient left message for patient to call and schedule 6 week lab appt.

## 2024-01-01 DIAGNOSIS — H524 Presbyopia: Secondary | ICD-10-CM | POA: Diagnosis not present

## 2024-01-05 ENCOUNTER — Other Ambulatory Visit: Payer: Self-pay

## 2024-01-13 NOTE — Progress Notes (Signed)
 Called patient and left a message for her to call back and schedule lab only visit for around (02/24/24)

## 2024-01-16 NOTE — Progress Notes (Signed)
 Lab appt scheduled.

## 2024-02-03 ENCOUNTER — Telehealth: Payer: Self-pay | Admitting: Nurse Practitioner

## 2024-02-03 NOTE — Telephone Encounter (Unsigned)
 Copied from CRM [id number]. Topic: Appointments - Scheduling Inquiry for Clinic >> Feb 03, 2024  3:08 PM Donee H wrote: Reason for CRM: Patient is stating would like to see if she can schedule an office visit regarding neck pain she is experiencing. There is no available  appointments until August. Patient would like to see if she can be seen either 7-16 or 7-17. Please follow up with patient at [phone]

## 2024-02-03 NOTE — Telephone Encounter (Signed)
 Called patient and left a message for her to call back to get scheduled. Cassandra Sparks has no openings that day, but she may be able to see another provider.

## 2024-02-04 NOTE — Telephone Encounter (Signed)
 Called patient to see if she still needed an appt. Pt states she is feeling better now that she has iced her neck.

## 2024-02-09 ENCOUNTER — Other Ambulatory Visit: Payer: Self-pay

## 2024-02-09 ENCOUNTER — Other Ambulatory Visit: Payer: Self-pay | Admitting: Nurse Practitioner

## 2024-02-09 MED ORDER — LISINOPRIL 10 MG PO TABS
10.0000 mg | ORAL_TABLET | Freq: Every day | ORAL | 5 refills | Status: DC
  Filled 2024-02-09: qty 30, 30d supply, fill #0
  Filled 2024-03-14: qty 30, 30d supply, fill #1

## 2024-02-10 ENCOUNTER — Other Ambulatory Visit: Payer: Self-pay

## 2024-02-11 ENCOUNTER — Other Ambulatory Visit: Payer: Self-pay

## 2024-02-11 MED FILL — Lisinopril Tab 10 MG: ORAL | 30 days supply | Qty: 30 | Fill #0 | Status: CN

## 2024-02-11 MED FILL — Hydrochlorothiazide Tab 25 MG: ORAL | 30 days supply | Qty: 30 | Fill #0 | Status: AC

## 2024-02-11 NOTE — Telephone Encounter (Signed)
 Requested Prescriptions  Pending Prescriptions Disp Refills   hydrochlorothiazide  (HYDRODIURIL ) 25 MG tablet 30 tablet 2    Sig: Take 1 tablet (25 mg total) by mouth daily for high blood pressure.     Cardiovascular: Diuretics - Thiazide Passed - 02/11/2024  3:39 PM      Passed - Cr in normal range and within 180 days    Creatinine  Date Value Ref Range Status  04/05/2012 0.85 0.60 - 1.30 mg/dL Final   Creatinine, Ser  Date Value Ref Range Status  11/25/2023 0.80 0.57 - 1.00 mg/dL Final         Passed - K in normal range and within 180 days    Potassium  Date Value Ref Range Status  11/25/2023 3.8 3.5 - 5.2 mmol/L Final  04/05/2012 3.4 (L) 3.5 - 5.1 mmol/L Final         Passed - Na in normal range and within 180 days    Sodium  Date Value Ref Range Status  11/25/2023 140 134 - 144 mmol/L Final  04/05/2012 144 136 - 145 mmol/L Final         Passed - Last BP in normal range    BP Readings from Last 1 Encounters:  12/24/23 127/77         Passed - Valid encounter within last 6 months    Recent Outpatient Visits           1 month ago Morbid obesity with BMI of 40.0-44.9, adult (HCC)   [place] [facility] [place], Delleker T, NP   2 months ago Morbid obesity with BMI of 40.0-44.9, adult (HCC)   [place] [facility] [place], Jolene T, NP               lisinopril  (ZESTRIL ) 10 MG tablet 30 tablet 2    Sig: Take 1 tablet (10 mg total) by mouth daily for high blood pressure.     Cardiovascular:  ACE Inhibitors Passed - 02/11/2024  3:39 PM      Passed - Cr in normal range and within 180 days    Creatinine  Date Value Ref Range Status  04/05/2012 0.85 0.60 - 1.30 mg/dL Final   Creatinine, Ser  Date Value Ref Range Status  11/25/2023 0.80 0.57 - 1.00 mg/dL Final         Passed - K in normal range and within 180 days    Potassium  Date Value Ref Range Status  11/25/2023 3.8 3.5 - 5.2 mmol/L Final  04/05/2012 3.4 (L) 3.5 - 5.1 mmol/L  Final         Passed - Patient is not pregnant      Passed - Last BP in normal range    BP Readings from Last 1 Encounters:  12/24/23 127/77         Passed - Valid encounter within last 6 months    Recent Outpatient Visits           1 month ago Morbid obesity with BMI of 40.0-44.9, adult (HCC)   [place] [facility] [place], Quincy T, NP   2 months ago Morbid obesity with BMI of 40.0-44.9, adult [facility])    [facility] [place], Melanie DASEN, NP

## 2024-02-12 ENCOUNTER — Other Ambulatory Visit: Payer: Self-pay

## 2024-02-14 DIAGNOSIS — M25512 Pain in left shoulder: Secondary | ICD-10-CM | POA: Diagnosis not present

## 2024-02-14 DIAGNOSIS — M7582 Other shoulder lesions, left shoulder: Secondary | ICD-10-CM | POA: Diagnosis not present

## 2024-02-14 DIAGNOSIS — S46812A Strain of other muscles, fascia and tendons at shoulder and upper arm level, left arm, initial encounter: Secondary | ICD-10-CM | POA: Diagnosis not present

## 2024-02-17 ENCOUNTER — Other Ambulatory Visit: Payer: Self-pay

## 2024-02-25 ENCOUNTER — Other Ambulatory Visit

## 2024-03-04 ENCOUNTER — Other Ambulatory Visit: Payer: Self-pay

## 2024-03-14 ENCOUNTER — Other Ambulatory Visit: Payer: Self-pay

## 2024-03-14 MED FILL — Hydrochlorothiazide Tab 25 MG: ORAL | 30 days supply | Qty: 30 | Fill #1 | Status: AC

## 2024-03-14 MED FILL — Lisinopril Tab 10 MG: ORAL | 30 days supply | Qty: 30 | Fill #0 | Status: AC

## 2024-03-15 ENCOUNTER — Other Ambulatory Visit: Payer: Self-pay

## 2024-03-15 MED ORDER — GABAPENTIN 300 MG PO CAPS
ORAL_CAPSULE | ORAL | 1 refills | Status: DC
  Filled 2024-03-15: qty 150, 30d supply, fill #0
  Filled 2024-04-21: qty 150, 30d supply, fill #1

## 2024-03-21 DIAGNOSIS — E7800 Pure hypercholesterolemia, unspecified: Secondary | ICD-10-CM | POA: Insufficient documentation

## 2024-03-21 NOTE — Patient Instructions (Incomplete)

## 2024-03-26 ENCOUNTER — Ambulatory Visit: Admitting: Nurse Practitioner

## 2024-03-26 DIAGNOSIS — E7800 Pure hypercholesterolemia, unspecified: Secondary | ICD-10-CM

## 2024-03-26 DIAGNOSIS — R7303 Prediabetes: Secondary | ICD-10-CM

## 2024-03-26 DIAGNOSIS — E063 Autoimmune thyroiditis: Secondary | ICD-10-CM

## 2024-03-26 DIAGNOSIS — N951 Menopausal and female climacteric states: Secondary | ICD-10-CM

## 2024-03-26 DIAGNOSIS — G4733 Obstructive sleep apnea (adult) (pediatric): Secondary | ICD-10-CM

## 2024-03-26 DIAGNOSIS — I10 Essential (primary) hypertension: Secondary | ICD-10-CM

## 2024-03-28 NOTE — Patient Instructions (Signed)
 Be Involved in Caring For Your Health:  Taking Medications When medications are taken as directed, they can greatly improve your health. But if they are not taken as prescribed, they may not work. In some cases, not taking them correctly can be harmful. To help ensure your treatment remains effective and safe, understand your medications and how to take them. Bring your medications to each visit for review by your provider.  Your lab results, notes, and after visit summary will be available on My Chart. We strongly encourage you to use this feature. If lab results are abnormal the clinic will contact you with the appropriate steps. If the clinic does not contact you assume the results are satisfactory. You can always view your results on My Chart. If you have questions regarding your health or results, please contact the clinic during office hours. You can also ask questions on My Chart.  We at [facility] are grateful that you chose [place] to provide your care. We strive to provide evidence-based and compassionate care and are always looking for feedback. If you get a survey from the clinic please complete this so we can hear your opinions.  Prediabetes: What to Know Prediabetes is when your blood sugar, also called glucose, is at a higher level than normal but not high enough for you to be diagnosed with type 2 diabetes (type 2 diabetes mellitus). Having prediabetes puts you at risk for getting type 2 diabetes. By making some healthy changes, you may be able to prevent or delay getting type 2 diabetes. This is important because type 2 diabetes can lead to serious problems. Some of these include: Heart disease. Stroke. Blindness. Kidney disease. Depression. Poor blood flow in the feet and legs. In very bad cases, this could lead to having a leg removed by surgery (amputation). What are the causes? The exact cause of prediabetes isn't known. It may result from insulin resistance. Insulin  resistance happens when cells in the body don't respond properly to insulin that the body makes. This can cause too much sugar to build up in the blood. High blood sugar, also called hyperglycemia, can develop. What increases the risk? Having a family member with type 2 diabetes. Being older than 53 years of age. Having had a temporary form of diabetes during a pregnancy. This is called gestational diabetes. Having had polycystic ovary syndrome (PCOS). Being overweight or obese. Being inactive and not getting much exercise. Having a history of heart disease. This may include problems with cholesterol levels, high levels of blood fats, or high blood pressure. What are the signs or symptoms? You may have no symptoms. If you do have symptoms, they may include: Increased hunger. Increased thirst. Needing to pee more often. Changes in how you see, like blurry vision. Feeling tired. How is this diagnosed? Prediabetes can be diagnosed with blood tests that check your blood sugar. One or more of these tests may be done: A fasting blood glucose (FBG) test. You won't be allowed to eat (you will fast) for at least 8 hours before a blood sample is taken. An A1C blood test, also called a hemoglobin A1C test. This test shows information about blood sugar levels over the past 2?3 months. An oral glucose tolerance test (OGTT). This test measures your blood sugar at two points in time: After you haven't eaten for a while. This is your baseline level. Two hours after you drink a beverage that has sugar in it. You may be diagnosed with prediabetes  if: Your FBG is 100?125 mg/dL (1.6-1.0 mmol/L). Your A1C level is 5.7?6.4% (39-46 mmol/mol). Your OGTT result is 140?199 mg/dL (9.6-04 mmol/L). These blood tests may need to be done again to be sure of the diagnosis. How is this treated? Treatment may include making changes to your diet and lifestyle. These changes can help lower your blood sugar and keep you  from getting type 2 diabetes. In some cases, medicine may be given to help lower your risk. Follow these instructions at home: Eating and drinking  Eat and drink as told. Follow a healthy meal plan. This includes eating lean proteins, whole grains, legumes, fresh fruits and vegetables, low-fat dairy products, and healthy fats. Meet with an expert in healthy eating called a dietitian. This person can help create a healthy eating plan that's right for you. Lifestyle Do moderate-intensity exercise. Do this for at least 30 minutes a day on 5 or more days each week, or as told by your health care provider. A mix of activities may be best. Good choices include brisk walking, swimming, biking, and weight lifting. Try to lose weight if your provider says it's OK. Losing 5-7% of your body weight can help reverse insulin resistance. Do not drink alcohol if: Your provider tells you not to drink. You're pregnant, may be pregnant, or plan to become pregnant. If you drink alcohol: Limit how much you have to: 0-1 drink a day if you're female. 0-2 drinks a day if you're female. Know how much alcohol is in your drink. In the [place] one drink is one 12 oz bottle of beer (355 mL), one 5 oz glass of wine (148 mL), or one 1 oz glass of hard liquor (44 mL). General instructions Take medicines only as told. You may be given medicines that help lower the risk of type 2 diabetes. Do not smoke, vape, or use nicotine or tobacco. Where to find more information American Diabetes Association: diabetes.org/about-diabetes/prediabetes Academy of Nutrition and Dietetics: eatright.org American Heart Association: Go to ThisJobs.cz. Click the search icon. Type "prediabetes" in the search box. Contact a health care provider if: You have any of these symptoms: Increased hunger. Peeing more often than usual. Increased thirst. Feeling tired. Changes in how you see, like blurry vision. Feeling like you may throw  up. Throwing up. Get help right away if: You have shortness of breath. You feel confused. This information is not intended to replace advice given to you by your health care provider. Make sure you discuss any questions you have with your health care provider. Document Revised: 02/09/2023 Document Reviewed: 02/09/2023 Elsevier Patient Education  2024 [employer].

## 2024-03-31 ENCOUNTER — Ambulatory Visit: Payer: Self-pay | Admitting: Nurse Practitioner

## 2024-03-31 ENCOUNTER — Ambulatory Visit: Admitting: Nurse Practitioner

## 2024-03-31 ENCOUNTER — Encounter: Payer: Self-pay | Admitting: Nurse Practitioner

## 2024-03-31 DIAGNOSIS — Z23 Encounter for immunization: Secondary | ICD-10-CM | POA: Diagnosis not present

## 2024-03-31 DIAGNOSIS — N951 Menopausal and female climacteric states: Secondary | ICD-10-CM

## 2024-03-31 DIAGNOSIS — E7800 Pure hypercholesterolemia, unspecified: Secondary | ICD-10-CM

## 2024-03-31 DIAGNOSIS — R7303 Prediabetes: Secondary | ICD-10-CM | POA: Diagnosis not present

## 2024-03-31 DIAGNOSIS — G4733 Obstructive sleep apnea (adult) (pediatric): Secondary | ICD-10-CM

## 2024-03-31 DIAGNOSIS — E063 Autoimmune thyroiditis: Secondary | ICD-10-CM

## 2024-03-31 DIAGNOSIS — I10 Essential (primary) hypertension: Secondary | ICD-10-CM

## 2024-03-31 DIAGNOSIS — M542 Cervicalgia: Secondary | ICD-10-CM

## 2024-03-31 LAB — BAYER DCA HB A1C WAIVED: HB A1C (BAYER DCA - WAIVED): 5.2 % (ref 4.8–5.6)

## 2024-03-31 MED ORDER — LISINOPRIL 10 MG PO TABS
10.0000 mg | ORAL_TABLET | Freq: Every day | ORAL | 3 refills | Status: AC
  Filled 2024-03-31 – 2024-04-21 (×2): qty 90, 90d supply, fill #0
  Filled 2024-05-20 – 2024-08-03 (×3): qty 90, 90d supply, fill #1

## 2024-03-31 MED ORDER — LEVOTHYROXINE SODIUM 25 MCG PO TABS
25.0000 ug | ORAL_TABLET | Freq: Every day | ORAL | 3 refills | Status: AC
  Filled 2024-03-31 – 2024-04-21 (×2): qty 90, 90d supply, fill #0
  Filled 2024-05-20 – 2024-08-03 (×4): qty 90, 90d supply, fill #1

## 2024-03-31 MED ORDER — HYDROCHLOROTHIAZIDE 25 MG PO TABS
25.0000 mg | ORAL_TABLET | Freq: Every day | ORAL | 3 refills | Status: AC
  Filled 2024-03-31 – 2024-04-21 (×2): qty 90, 90d supply, fill #0
  Filled 2024-05-20 – 2024-08-03 (×3): qty 90, 90d supply, fill #1

## 2024-03-31 NOTE — Assessment & Plan Note (Signed)
 Chronic, ongoing.  Continue Levothyroxine  at current dose and adjust as needed.  Labs today.

## 2024-03-31 NOTE — Assessment & Plan Note (Addendum)
 Chronic, stable with BP at goal.  Continue Lisinopril  and HCTZ at current doses and adjust as needed.  Urine ALB 80 May 2025. Recommend she monitor BP at least a few mornings a week at home and document. DASH diet at home. Labs today: CMP.

## 2024-03-31 NOTE — Progress Notes (Signed)
 A1c still stable at 5.2%.  Great job!!

## 2024-03-31 NOTE — Assessment & Plan Note (Signed)
 BMI 51.92, continues on Wegovy  and Metformin  which are offering benefit to prediabetes and OSA + are assisting with weight loss = 20 lbs lost since May 2025.  Will continue these medications, she reports they are covered. Recommended eating smaller high protein, low fat meals more frequently and exercising 30 mins a day 5 times a week with a goal of 10-15lb weight loss in the next 3 months. Patient voiced their understanding and motivation to adhere to these recommendations.

## 2024-03-31 NOTE — Assessment & Plan Note (Signed)
 Ongoing, A1c today 5.2%.  Stable level with Wegovy  and Metformin  on board.  Will continue these medications and adjust as needed.  She reports Wegovy  has been covered.  Monitor diet closely and recommend regular activity. Urine ALB 80 May 2025.

## 2024-03-31 NOTE — Progress Notes (Signed)
 BP 119/88 (BP Location: Left Arm, Patient Position: Sitting, Cuff Size: Large)   Pulse 83   Temp 98.4 F (36.9 C) (Oral)   Resp 16   Ht 5' 5.79 (1.671 m)   Wt (!) 319 lb 9.6 oz (145 kg)   SpO2 98%   BMI 51.92 kg/m    Subjective:    Patient ID: Cassandra Sparks, female    DOB: January 16, 1971, 53 y.o.   MRN: [id number]  HPI: Cassandra Sparks is a 53 y.o. female  Chief Complaint  Patient presents with   Hypertension    No home checks but no recent concerns she is aware of.    IFG   Hypothyroidism   Neck Pain    Neck pain into left bicep and feels it is muscle related. Had seen to UC but it has not resolved. No imaging and no known injuries.    HYPERTENSION without Chronic Kidney Disease Taking Lisinopril  and HCTZ.  Hypertension status: stable  Satisfied with current treatment? yes Duration of hypertension: chronic BP monitoring frequency:  not checking BP range:  BP medication side effects:  no Medication compliance: good compliance Aspirin: no Recurrent headaches: no Visual changes: no Palpitations: no Dyspnea: no Chest pain: no Lower extremity edema: no Dizzy/lightheaded: no  The 10-year ASCVD risk score (Arnett DK, et al., 2019) is: 6.4%   Values used to calculate the score:     Age: 73 years     Clincally relevant sex: Female     Is Non-Hispanic African American: Yes     Diabetic: Yes     Tobacco smoker: No     Systolic Blood Pressure: 119 mmHg     Is BP treated: Yes     HDL Cholesterol: 61 mg/dL     Total Cholesterol: 187 mg/dL   Impaired Fasting Glucose Has lost 20 lbs since May 2025, continues Metformin  and Semaglutide .  Feels appetite is still well controlled on current regimen.  HbA1C:  Lab Results  Component Value Date   HGBA1C 5.2 03/31/2024  Duration of elevated blood sugar: chronic Polydipsia: no Polyuria: no Weight change: no Visual disturbance: no Glucose Monitoring: no    Accucheck frequency: Not Checking    Fasting glucose:     Post  prandial:  Diabetic Education: Not Completed Family history of diabetes: mother and father   HYPOTHYROIDISM Taking Levothyroxine  without issue. Thyroid  control status:controlled Satisfied with current treatment? yes Medication side effects: no Medication compliance: good compliance Etiology of hypothyroidism: Hashimoto's Recent dose adjustment:no Fatigue: no Cold intolerance: no Heat intolerance: no Weight gain: no Weight loss: no Constipation: no Diarrhea/loose stools: no Palpitations: no Lower extremity edema: no Anxiety/depressed mood: no   NECK PAIN Went to urgent care on 02/14/24, was given steroid, Meloxicam , and muscle relaxer.  Pain has improved, now only notices when tries to get up or roll over in bed. Diagnosis: strain of left trapezius Status: improved Treatments attempted: Meloxicam , Prednisone , and muscle relaxer  Compliant with recommended treatment: yes Relief with NSAIDs?:  moderate Location:Left Duration:chronic Severity: 0/10 today Quality: dull and aching Frequency: intermittent Radiation: down arm just above elbow Aggravating factors: as above Alleviating factors: prescriptions as noted above Weakness:  no Paresthesias / decreased sensation:  no  Fevers:  no      03/31/2024    8:22 AM 11/25/2023    3:51 PM 07/02/2016    1:42 PM  Depression screen PHQ 2/9  Decreased Interest 0 0 0  Down, Depressed, Hopeless 0 0 0  PHQ - 2 Score 0 0 0  Altered sleeping 0 0   Tired, decreased energy 0 0   Change in appetite 0 0   Feeling bad or failure about yourself  0 0   Trouble concentrating 0 0   Moving slowly or fidgety/restless 0 0   Suicidal thoughts 0 0   PHQ-9 Score 0 0   Difficult doing work/chores  Not difficult at all        03/31/2024    8:22 AM 11/25/2023    3:51 PM  GAD 7 : Generalized Anxiety Score  Nervous, Anxious, on Edge 0 0  Control/stop worrying 0 0  Worry too much - different things 0 0  Trouble relaxing 0 0  Restless 0 0   Easily annoyed or irritable 0 0  Afraid - awful might happen 0 0  Total GAD 7 Score 0 0  Anxiety Difficulty  Not difficult at all   Relevant past medical, surgical, family and social history reviewed and updated as indicated. Interim medical history since our last visit reviewed. Allergies and medications reviewed and updated.  Review of Systems  Constitutional:  Negative for activity change, appetite change, diaphoresis, fatigue and fever.  Respiratory:  Negative for cough, chest tightness, shortness of breath and wheezing.   Cardiovascular:  Negative for chest pain, palpitations and leg swelling.  Gastrointestinal: Negative.   Endocrine: Negative.   Musculoskeletal:  Positive for neck pain.  Neurological: Negative.   Psychiatric/Behavioral: Negative.      Per HPI unless specifically indicated above     Objective:     BP 119/88 (BP Location: Left Arm, Patient Position: Sitting, Cuff Size: Large)   Pulse 83   Temp 98.4 F (36.9 C) (Oral)   Resp 16   Ht 5' 5.79 (1.671 m)   Wt (!) 319 lb 9.6 oz (145 kg)   SpO2 98%   BMI 51.92 kg/m   Wt Readings from Last 3 Encounters:  03/31/24 (!) 319 lb 9.6 oz (145 kg)  12/24/23 (!) 336 lb 12.8 oz (152.8 kg)  11/25/23 (!) 339 lb (153.8 kg)    Physical Exam Vitals and nursing note reviewed.  Constitutional:      General: She is awake. She is not in acute distress.    Appearance: She is well-developed and well-groomed. She is obese. She is not ill-appearing or toxic-appearing.  HENT:     Head: Normocephalic.     Right Ear: Hearing and external ear normal.     Left Ear: Hearing and external ear normal.  Eyes:     General: Lids are normal.        Right eye: No discharge.        Left eye: No discharge.     Conjunctiva/sclera: Conjunctivae normal.     Pupils: Pupils are equal, round, and reactive to light.  Neck:     Thyroid : No thyromegaly.     Vascular: No carotid bruit.  Cardiovascular:     Rate and Rhythm: Normal rate and  regular rhythm.     Heart sounds: Normal heart sounds. No murmur heard.    No gallop.  Pulmonary:     Effort: Pulmonary effort is normal. No accessory muscle usage or respiratory distress.     Breath sounds: Normal breath sounds.  Abdominal:     General: Bowel sounds are normal. There is no distension.     Palpations: Abdomen is soft.     Tenderness: There is no abdominal tenderness.  Musculoskeletal:  Cervical back: Normal range of motion and neck supple. No pain with movement or muscular tenderness. Normal range of motion.     Right lower leg: No edema.     Left lower leg: No edema.  Lymphadenopathy:     Cervical: No cervical adenopathy.  Skin:    General: Skin is warm and dry.  Neurological:     Mental Status: She is alert and oriented to person, place, and time.     Deep Tendon Reflexes: Reflexes are normal and symmetric.     Reflex Scores:      Brachioradialis reflexes are 2+ on the right side and 2+ on the left side.      Patellar reflexes are 2+ on the right side and 2+ on the left side. Psychiatric:        Attention and Perception: Attention normal.        Mood and Affect: Mood normal.        Speech: Speech normal.        Behavior: Behavior normal. Behavior is cooperative.        Thought Content: Thought content normal.    Results for orders placed or performed in visit on 03/31/24  Bayer DCA Hb A1c Waived   Collection Time: 03/31/24  8:29 AM  Result Value Ref Range   HB A1C (BAYER DCA - WAIVED) 5.2 4.8 - 5.6 %      Assessment & Plan:   Problem List Items Addressed This Visit       Cardiovascular and Mediastinum   Essential hypertension   Chronic, stable with BP at goal.  Continue Lisinopril  and HCTZ at current doses and adjust as needed.  Urine ALB 80 May 2025. Recommend she monitor BP at least a few mornings a week at home and document. DASH diet at home. Labs today: CMP.       Relevant Medications   hydrochlorothiazide  (HYDRODIURIL ) 25 MG tablet    lisinopril  (ZESTRIL ) 10 MG tablet   Other Relevant Orders   Comprehensive metabolic panel with GFR     Endocrine   Hypothyroidism due to Hashimoto thyroiditis   Chronic, ongoing.  Continue Levothyroxine  at current dose and adjust as needed.  Labs today.      Relevant Medications   levothyroxine  (SYNTHROID ) 25 MCG tablet   Other Relevant Orders   T4, free   TSH     Other   Prediabetes   Ongoing, A1c today 5.2%.  Stable level with Wegovy  and Metformin  on board.  Will continue these medications and adjust as needed.  She reports Wegovy  has been covered.  Monitor diet closely and recommend regular activity. Urine ALB 80 May 2025.      Relevant Orders   Bayer DCA Hb A1c Waived (Completed)   Morbid obesity (HCC) - Primary   BMI 51.92, continues on Wegovy  and Metformin  which are offering benefit to prediabetes and OSA + are assisting with weight loss = 20 lbs lost since May 2025.  Will continue these medications, she reports they are covered. Recommended eating smaller high protein, low fat meals more frequently and exercising 30 mins a day 5 times a week with a goal of 10-15lb weight loss in the next 3 months. Patient voiced their understanding and motivation to adhere to these recommendations.       Elevated low density lipoprotein (LDL) cholesterol level   Noted on past labs.  Recheck today.  Continue heavy focus on diet and regular exercise.      Relevant Orders  Comprehensive metabolic panel with GFR   Lipid Panel w/o Chol/HDL Ratio   Other Visit Diagnoses       Neck pain       Acute and improved at this time. Continue regimen at home as needed.     Flu vaccine need       Flu vaccine today, educated patient.   Relevant Orders   Flu vaccine trivalent PF, 6mos and older(Flulaval,Afluria,Fluarix,Fluzone) (Completed)        Follow up plan: Return in about 6 months (around 09/28/2024) for HTN/HLD, PREDIABETES, OSA, THYROID , WEIGHT CHECK.

## 2024-03-31 NOTE — Assessment & Plan Note (Signed)
 Noted on past labs.  Recheck today.  Continue heavy focus on diet and regular exercise.

## 2024-04-01 ENCOUNTER — Other Ambulatory Visit: Payer: Self-pay

## 2024-04-01 LAB — LIPID PANEL W/O CHOL/HDL RATIO
Cholesterol, Total: 187 mg/dL (ref 100–199)
HDL: 54 mg/dL (ref >39)
LDL Chol Calc (NIH): 122 mg/dL — ABNORMAL HIGH (ref 0–99)
Triglycerides: 60 mg/dL (ref 0–149)
VLDL Cholesterol Cal: 11 mg/dL (ref 5–40)

## 2024-04-01 LAB — COMPREHENSIVE METABOLIC PANEL WITH GFR
ALT: 10 IU/L (ref 0–32)
AST: 14 IU/L (ref 0–40)
Albumin: 3.9 g/dL (ref 3.8–4.9)
Alkaline Phosphatase: 73 IU/L (ref 44–121)
BUN/Creatinine Ratio: 15 (ref 9–23)
BUN: 10 mg/dL (ref 6–24)
Bilirubin Total: 0.2 mg/dL (ref 0.0–1.2)
CO2: 25 mmol/L (ref 20–29)
Calcium: 8.5 mg/dL — ABNORMAL LOW (ref 8.7–10.2)
Chloride: 102 mmol/L (ref 96–106)
Creatinine, Ser: 0.65 mg/dL (ref 0.57–1.00)
Globulin, Total: 2.2 g/dL (ref 1.5–4.5)
Glucose: 78 mg/dL (ref 70–99)
Potassium: 4.2 mmol/L (ref 3.5–5.2)
Sodium: 142 mmol/L (ref 134–144)
Total Protein: 6.1 g/dL (ref 6.0–8.5)
eGFR: 105 mL/min/1.73 (ref >59)

## 2024-04-01 LAB — TSH: TSH: 3.37 u[IU]/mL (ref 0.450–4.500)

## 2024-04-01 LAB — T4, FREE: Free T4: 1.16 ng/dL (ref 0.82–1.77)

## 2024-04-01 NOTE — Progress Notes (Signed)
 Contacted via MyChart The 10-year ASCVD risk score (Arnett DK, et al., 2019) is: 7.4%   Values used to calculate the score:     Age: 53 years     Clincally relevant sex: Female     Is Non-Hispanic African American: Yes     Diabetic: Yes     Tobacco smoker: No     Systolic Blood Pressure: 119 mmHg     Is BP treated: Yes     HDL Cholesterol: 54 mg/dL     Total Cholesterol: 187 mg/dL  Good evening Cassandra Sparks, your labs have returned: - Kidney function, creatinine and eGFR, remains normal, as is liver function, AST and ALT. Calcium mildly low, ensure eating and drinking plenty of calcium rich foods daily. - LDL, bad cholesterol, elevated with a little trend up.  At this time continue your focus on diet and regular activity as you are doing amazing!! - Remainder of labs stable.  No medication changes needed.  Any questions? Keep being amazing!!  Thank you for allowing me to participate in your care.  I appreciate you. Kindest regards, Dimarco Minkin

## 2024-04-21 ENCOUNTER — Other Ambulatory Visit: Payer: Self-pay

## 2024-05-19 ENCOUNTER — Telehealth: Payer: Self-pay

## 2024-05-19 NOTE — Telephone Encounter (Signed)
 Copied from CRM [id number]. Topic: General - Other >> May 19, 2024  9:58 AM Wess RAMAN wrote: Reason for CRM: Patient would like a copy of her most recent flu shot sent via mychart.  Callback #: [phone]

## 2024-05-19 NOTE — Telephone Encounter (Signed)
 Ok for E2C2 to review.  Please advise that she can pull her immunizations using her mychart account. There is a place listed as immunizations that she can see the information. If not she would need to come into office and request the print out.

## 2024-05-20 ENCOUNTER — Other Ambulatory Visit: Payer: Self-pay

## 2024-05-20 ENCOUNTER — Telehealth: Payer: Self-pay | Admitting: Nurse Practitioner

## 2024-05-20 NOTE — Telephone Encounter (Signed)
 Called and advised patient that she can pull her immunizations using her mychart account. If not she would need to come into office and request the print out.

## 2024-05-20 NOTE — Telephone Encounter (Unsigned)
 Copied from CRM #[id number]. Topic: Medical Record Request - Records Request >> May 20, 2024  8:59 AM Tiffini S wrote: Reason for CRM: Patient is needing a copy of [facility] shot records for 03/31/24 uploaded into [facility]   Please call the patient back at [phone]

## 2024-05-21 ENCOUNTER — Other Ambulatory Visit: Payer: Self-pay

## 2024-05-21 NOTE — Telephone Encounter (Signed)
 Patient called notified us  that her daughter Jazline will be picking up her flu shot record.

## 2024-05-24 ENCOUNTER — Other Ambulatory Visit: Payer: Self-pay

## 2024-05-25 ENCOUNTER — Other Ambulatory Visit: Payer: Self-pay

## 2024-05-25 MED ORDER — GABAPENTIN 300 MG PO CAPS
ORAL_CAPSULE | ORAL | 1 refills | Status: AC
  Filled 2024-05-25: qty 150, 30d supply, fill #0
  Filled 2024-06-26: qty 150, 30d supply, fill #1

## 2024-05-26 ENCOUNTER — Other Ambulatory Visit: Payer: Self-pay

## 2024-06-26 ENCOUNTER — Other Ambulatory Visit: Payer: Self-pay

## 2024-06-27 ENCOUNTER — Other Ambulatory Visit: Payer: Self-pay

## 2024-06-28 ENCOUNTER — Other Ambulatory Visit: Payer: Self-pay

## 2024-06-28 ENCOUNTER — Other Ambulatory Visit: Payer: Self-pay | Admitting: Nurse Practitioner

## 2024-06-28 MED ORDER — METFORMIN HCL ER 500 MG PO TB24
1000.0000 mg | ORAL_TABLET | Freq: Every day | ORAL | 11 refills | Status: AC
  Filled 2024-06-28: qty 180, 90d supply, fill #0

## 2024-06-30 NOTE — Telephone Encounter (Signed)
 Duplicate request, refilled 06/28/24.  Requested Prescriptions  Pending Prescriptions Disp Refills   metFORMIN  (GLUCOPHAGE -XR) 500 MG 24 hr tablet 60 tablet 11    Sig: Take 2 tablets by mouth at bedtime.     Endocrinology:  Diabetes - Biguanides Passed - 06/30/2024  8:51 AM      Passed - Cr in normal range and within 360 days    Creatinine  Date Value Ref Range Status  04/05/2012 0.85 0.60 - 1.30 mg/dL Final   Creatinine, Ser  Date Value Ref Range Status  03/31/2024 0.65 0.57 - 1.00 mg/dL Final         Passed - HBA1C is between 0 and 7.9 and within 180 days    HB A1C (BAYER DCA - WAIVED)  Date Value Ref Range Status  03/31/2024 5.2 4.8 - 5.6 % Final    Comment:             Prediabetes: 5.7 - 6.4          Diabetes: >6.4          Glycemic control for adults with diabetes: <7.0          Passed - eGFR in normal range and within 360 days    EGFR (African American)  Date Value Ref Range Status  04/05/2012 >60  Final   GFR calc Af Amer  Date Value Ref Range Status  07/29/2019 >60 >60 mL/min Final   EGFR (Non-African Amer.)  Date Value Ref Range Status  04/05/2012 >60  Final    Comment:    eGFR values <11mL/min/1.73 m2 may be an indication of chronic kidney disease (CKD). Calculated eGFR is useful in patients with stable renal function. The eGFR calculation will not be reliable in acutely ill patients when serum creatinine is changing rapidly. It is not useful in  patients on dialysis. The eGFR calculation may not be applicable to patients at the low and high extremes of body sizes, pregnant women, and vegetarians.    GFR, Estimated  Date Value Ref Range Status  01/18/2021 >60 >60 mL/min Final    Comment:    (NOTE) Calculated using the CKD-EPI Creatinine Equation (2021)    eGFR  Date Value Ref Range Status  03/31/2024 105 >59 mL/min/1.73 Final         Passed - B12 Level in normal range and within 720 days    Vitamin B-12  Date Value Ref Range Status   11/25/2023 1,138 232 - 1,245 pg/mL Final         Passed - Valid encounter within last 6 months    Recent Outpatient Visits           3 months ago Morbid obesity (HCC)   [facility] [place], [place] T, NP   6 months ago Morbid obesity with BMI of 40.0-44.9, adult (HCC)   [place] [facility] [place], Jolene T, NP   7 months ago Morbid obesity with BMI of 40.0-44.9, adult (HCC)   [place] [facility] [place], [place] T, NP              Passed - CBC within normal limits and completed in the last 12 months    WBC  Date Value Ref Range Status  11/25/2023 4.5 3.4 - 10.8 x10E3/uL Final  01/18/2021 5.9 4.0 - 10.5 K/uL Final   RBC  Date Value Ref Range Status  11/25/2023 5.07 3.77 - 5.28 x10E6/uL Final  01/18/2021 4.55 3.87 - 5.11 MIL/uL Final  Hemoglobin  Date Value Ref Range Status  11/25/2023 13.0 11.1 - 15.9 g/dL Final   Hematocrit  Date Value Ref Range Status  11/25/2023 40.7 34.0 - 46.6 % Final   MCHC  Date Value Ref Range Status  11/25/2023 31.9 31.5 - 35.7 g/dL Final  93/69/7977 67.6 30.0 - 36.0 g/dL Final   [employer]  Date Value Ref Range Status  11/25/2023 25.6 (L) 26.6 - 33.0 pg Final  01/18/2021 26.2 26.0 - 34.0 pg Final   MCV  Date Value Ref Range Status  11/25/2023 80 79 - 97 fL Final  04/05/2012 76 (L) 80 - 100 fL Final   No results found for: PLTCOUNTKUC, LABPLAT, POCPLA RDW  Date Value Ref Range Status  11/25/2023 14.0 11.7 - 15.4 % Final  04/05/2012 14.7 (H) 11.5 - 14.5 % Final

## 2024-07-01 ENCOUNTER — Other Ambulatory Visit: Payer: Self-pay

## 2024-08-03 ENCOUNTER — Other Ambulatory Visit (HOSPITAL_COMMUNITY): Payer: Self-pay

## 2024-08-03 ENCOUNTER — Other Ambulatory Visit: Payer: Self-pay
